# Patient Record
Sex: Female | Born: 1980 | Race: Black or African American | Hispanic: No | Smoking: Never smoker
Health system: Southern US, Community
[De-identification: ages and names within clinical notes are randomized; demographics above are authoritative.]

## PROBLEM LIST (undated history)

## (undated) DIAGNOSIS — J3501 Chronic tonsillitis: Secondary | ICD-10-CM

## (undated) HISTORY — PX: TUBAL LIGATION: SHX77

---

## 2014-11-08 ENCOUNTER — Emergency Department (HOSPITAL_COMMUNITY)
Admission: EM | Admit: 2014-11-08 | Discharge: 2014-11-08 | Disposition: A | Discharge status: Home / Self Care | Payer: Medicaid - Out of State | Attending: Emergency Medicine | Admitting: Emergency Medicine

## 2014-11-08 ENCOUNTER — Emergency Department (HOSPITAL_COMMUNITY): Payer: Medicaid - Out of State

## 2014-11-08 ENCOUNTER — Encounter (HOSPITAL_COMMUNITY): Payer: Self-pay

## 2014-11-08 DIAGNOSIS — S70352D Superficial foreign body, left thigh, subsequent encounter: Secondary | ICD-10-CM | POA: Insufficient documentation

## 2014-11-08 DIAGNOSIS — Z4800 Encounter for change or removal of nonsurgical wound dressing: Secondary | ICD-10-CM | POA: Diagnosis present

## 2014-11-08 DIAGNOSIS — W3400XD Accidental discharge from unspecified firearms or gun, subsequent encounter: Secondary | ICD-10-CM | POA: Insufficient documentation

## 2014-11-08 DIAGNOSIS — W3400XA Accidental discharge from unspecified firearms or gun, initial encounter: Secondary | ICD-10-CM

## 2014-11-08 MED ORDER — ACETAMINOPHEN 325 MG PO TABS
650.0000 mg | ORAL_TABLET | Freq: Once | ORAL | Status: AC
  Administered 2014-11-08: 650 mg via ORAL
  Filled 2014-11-08: qty 2

## 2014-11-08 NOTE — ED Notes (Signed)
Patient left ED at 1720 with pain 4/10

## 2014-11-08 NOTE — Discharge Instructions (Signed)
If you were given medicines take as directed.  If you are on coumadin or contraceptives realize their levels and effectiveness is altered by many different medicines.  If you have any reaction (rash, tongues swelling, other) to the medicines stop taking and see a physician.   Please follow up as directed and return to the ER or see a physician for new or worsening symptoms.  Thank you. Filed Vitals:   11/08/14 1431  BP: 97/60  Pulse: 63  Temp: 98.1 F (36.7 C)  TempSrc: Oral  Resp: 18  Height: 5\' 4"  (1.626 m)  Weight: 115 lb (52.164 kg)  SpO2: 100%
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
## 2014-11-08 NOTE — ED Notes (Signed)
Left leg warm and dry, pedal pulse present.  Dressing dry and intact.

## 2016-02-02 ENCOUNTER — Emergency Department
Admission: EM | Admit: 2016-02-02 | Discharge: 2016-02-02 | Discharge status: Against Medical Advice | Payer: Medicaid Other | Attending: Emergency Medicine | Admitting: Emergency Medicine

## 2016-02-02 ENCOUNTER — Encounter: Payer: Self-pay | Admitting: Medical Oncology

## 2016-02-02 DIAGNOSIS — N946 Dysmenorrhea, unspecified: Secondary | ICD-10-CM | POA: Diagnosis not present

## 2016-02-02 DIAGNOSIS — Z3202 Encounter for pregnancy test, result negative: Secondary | ICD-10-CM | POA: Insufficient documentation

## 2016-02-02 DIAGNOSIS — R102 Pelvic and perineal pain: Secondary | ICD-10-CM | POA: Diagnosis present

## 2016-02-02 LAB — POCT PREGNANCY, URINE: Preg Test, Ur: NEGATIVE

## 2016-02-02 NOTE — ED Notes (Signed)
Pt not in room or bathroom, Dr Joni Fears made aware.

## 2016-02-02 NOTE — ED Notes (Signed)
Pt reports starting her period 3 days ago and since has been having more than usual lower abd cramping.

## 2016-02-02 NOTE — ED Provider Notes (Signed)
[facility] Emergency Department Provider Note  ____________________________________________  Time seen: 11:00 AM  I have reviewed the triage vital signs and the nursing notes.   HISTORY  Chief Complaint Abdominal Cramping    HPI Levina Frutoso Chase is a 35 y.o. female who complains of pelvic cramping for the past 3 days while having her period. This is a normal time for her but normally her periods are lighter and this time around it feels heavier and more crampy with severe pain. No vaginal discharge no urinary symptoms. No chest pain shortness of breath or other concerns. No other medical history.     History reviewed. No pertinent past medical history.   There are no active problems to display for this patient.    Past Surgical History  Procedure Laterality Date  . Tubal ligation       Current Outpatient Rx  Name  Route  Sig  Dispense  Refill  . HYDROcodone-acetaminophen (NORCO/VICODIN) 5-325 MG per tablet   Oral   Take 1 tablet by mouth every 6 (six) hours as needed for moderate pain.            Allergies Percocet   No family history on file.  Social History Social History  Substance Use Topics  . Smoking status: Never Smoker   . Smokeless tobacco: None  . Alcohol Use: No    Review of Systems  Constitutional:   No fever or chills. No weight changes Eyes:   No vision changes.  ENT:   No sore throat. No rhinorrhea. Cardiovascular:   No chest pain. Respiratory:   No dyspnea or cough. Gastrointestinal:   Also to pelvic pain without vomiting or diarrhea.  No BRBPR or melena. Genitourinary:   Negative for dysuria or difficulty urinating. Musculoskeletal:   Negative for focal pain or swelling Skin:   Negative for rash. Neurological:   Negative for headaches, focal weakness or numbness.  10-point ROS otherwise negative.  ____________________________________________   PHYSICAL EXAM:  VITAL SIGNS: ED Triage Vitals  Enc  Vitals Group     BP 02/02/16 0754 109/67 mmHg     Pulse Rate 02/02/16 0754 73     Resp 02/02/16 0754 18     Temp 02/02/16 0754 98.4 F (36.9 C)     Temp Source 02/02/16 0754 Temporal     SpO2 02/02/16 0754 99 %     Weight 02/02/16 0754 115 lb (52.164 kg)     Height 02/02/16 0754 5\' 4"  (1.626 m)     Head Cir --      Peak Flow --      Pain Score 02/02/16 0754 10     Pain Loc --      Pain Edu? --      Excl. in [place]? --     Vital signs reviewed, nursing assessments reviewed.   Constitutional:   Alert and oriented. Well appearing and in no distress. Eyes:   No scleral icterus. No conjunctival pallor. PERRL. EOMI ENT   Head:   Normocephalic and atraumatic.   Nose:   No congestion/rhinnorhea. No septal hematoma   Mouth/Throat:   MMM, no pharyngeal erythema. No peritonsillar mass.    Neck:   No stridor. No SubQ emphysema. No meningismus. Hematological/Lymphatic/Immunilogical:   No cervical lymphadenopathy. Cardiovascular:   RRR. Symmetric bilateral radial and DP pulses.  No murmurs.  Respiratory:   Normal respiratory effort without tachypnea nor retractions. Breath sounds are clear and equal bilaterally. No wheezes/rales/rhonchi. Gastrointestinal:   Soft and  nontender. Non distended. There is no CVA tenderness.  No rebound, rigidity, or guarding. Genitourinary:   deferred Musculoskeletal:   Nontender with normal range of motion in all extremities. No joint effusions.  No lower extremity tenderness.  No edema. Neurologic:   Normal speech and language.  CN 2-10 normal. Motor grossly intact. No gross focal neurologic deficits are appreciated.  Skin:    Skin is warm, dry and intact. No rash noted.  No petechiae, purpura, or bullae. Psychiatric:   Mood and affect are normal. ____________________________________________    LABS (pertinent positives/negatives) (all labs ordered are listed, but only abnormal results are displayed) Labs Reviewed  POCT PREGNANCY, URINE    ____________________________________________   EKG    ____________________________________________    RADIOLOGY    ____________________________________________   PROCEDURES   ____________________________________________   INITIAL IMPRESSION / [place] / ED COURSE  Pertinent labs & imaging results that were available during my care of the patient were reviewed by me and considered in my medical decision making (see chart for details).  Patient well appearing no acute distress normal vital signs. Presents with dysmenorrhea. No signs or symptoms to suggest UTI pyelonephritis STI PID TOA. Pregnancy test negative. I discussed with the patient that we should just manage this expectantly and control her symptoms as this is most likely a usual period that is just more severe pain than normal. I offered her tramadol or Percocet for the pain. However, while I was busy with another patient experiencing emergency condition, this patient eloped from the emergency department by her to receiving discharge instructions and prescriptions. She is medically stable and suitable for outpatient follow-up. We did discuss taking NSAIDs for pain control.     ____________________________________________   FINAL CLINICAL IMPRESSION(S) / ED DIAGNOSES  Final diagnoses:  Dysmenorrhea      Carrie Mew, MD 02/02/16 1217

## 2016-02-02 NOTE — ED Notes (Signed)
Pt resting in bed.

## 2017-03-11 ENCOUNTER — Emergency Department
Admission: EM | Admit: 2017-03-11 | Discharge: 2017-03-12 | Disposition: A | Discharge status: Home / Self Care | Payer: Medicaid Other | Attending: Emergency Medicine | Admitting: Emergency Medicine

## 2017-03-11 ENCOUNTER — Encounter: Payer: Self-pay | Admitting: Emergency Medicine

## 2017-03-11 DIAGNOSIS — N946 Dysmenorrhea, unspecified: Secondary | ICD-10-CM | POA: Diagnosis not present

## 2017-03-11 DIAGNOSIS — D259 Leiomyoma of uterus, unspecified: Secondary | ICD-10-CM | POA: Diagnosis not present

## 2017-03-11 DIAGNOSIS — R109 Unspecified abdominal pain: Secondary | ICD-10-CM

## 2017-03-11 DIAGNOSIS — Z79899 Other long term (current) drug therapy: Secondary | ICD-10-CM | POA: Diagnosis not present

## 2017-03-11 LAB — URINALYSIS, COMPLETE (UACMP) WITH MICROSCOPIC
Bacteria, UA: NONE SEEN
Bilirubin Urine: NEGATIVE
Glucose, UA: NEGATIVE mg/dL
Hgb urine dipstick: NEGATIVE
Ketones, ur: NEGATIVE mg/dL
Leukocytes, UA: NEGATIVE
Nitrite: NEGATIVE
PH: 5 (ref 5.0–8.0)
Protein, ur: 30 mg/dL — AB
SPECIFIC GRAVITY, URINE: 1.034 — AB (ref 1.005–1.030)

## 2017-03-11 LAB — COMPREHENSIVE METABOLIC PANEL
ALK PHOS: 65 U/L (ref 38–126)
ALT: 10 U/L — ABNORMAL LOW (ref 14–54)
AST: 16 U/L (ref 15–41)
Albumin: 4 g/dL (ref 3.5–5.0)
Anion gap: 6 (ref 5–15)
BUN: 12 mg/dL (ref 6–20)
CO2: 23 mmol/L (ref 22–32)
CREATININE: 0.57 mg/dL (ref 0.44–1.00)
Calcium: 8.6 mg/dL — ABNORMAL LOW (ref 8.9–10.3)
Chloride: 105 mmol/L (ref 101–111)
Glucose, Bld: 102 mg/dL — ABNORMAL HIGH (ref 65–99)
Potassium: 3.8 mmol/L (ref 3.5–5.1)
Sodium: 134 mmol/L — ABNORMAL LOW (ref 135–145)
Total Bilirubin: 0.8 mg/dL (ref 0.3–1.2)
Total Protein: 7.4 g/dL (ref 6.5–8.1)

## 2017-03-11 LAB — CBC
HCT: 37.9 % (ref 35.0–47.0)
Hemoglobin: 12.8 g/dL (ref 12.0–16.0)
MCH: 30.7 pg (ref 26.0–34.0)
MCHC: 33.9 g/dL (ref 32.0–36.0)
MCV: 90.7 fL (ref 80.0–100.0)
PLATELETS: 237 10*3/uL (ref 150–440)
RBC: 4.18 MIL/uL (ref 3.80–5.20)
RDW: 12.2 % (ref 11.5–14.5)
WBC: 8.7 10*3/uL (ref 3.6–11.0)

## 2017-03-11 LAB — LIPASE, BLOOD: Lipase: 24 U/L (ref 11–51)

## 2017-03-11 LAB — POCT PREGNANCY, URINE: Preg Test, Ur: NEGATIVE

## 2017-03-11 MED ORDER — ONDANSETRON 4 MG PO TBDP
4.0000 mg | ORAL_TABLET | Freq: Once | ORAL | Status: AC | PRN
  Administered 2017-03-11: 4 mg via ORAL
  Filled 2017-03-11: qty 1

## 2017-03-11 NOTE — ED Triage Notes (Signed)
Pt ambulatory to triage in NAD, report lower abd cramping since yesterday, reports nausea w/o vomiting.  Denies urinary sx.  Pt started period yesterday.

## 2017-03-11 NOTE — ED Notes (Signed)
Pt states unable to give urine sample at this time, pt given specimen cup and asked to provide sample when able.  Pt verbalized understanding.

## 2017-03-11 NOTE — ED Notes (Addendum)
Patient reports that she has this abdominal pain every month with her period. Patient denies any hx of cyst. Patient reports no change in her periods, no excess bleeding or clots. Patient states the nausea medication did help some. Patient reports taking midol, motrin and BC powders without relief.

## 2017-03-11 NOTE — ED Notes (Signed)
ED Provider at bedside. 

## 2017-03-12 ENCOUNTER — Emergency Department: Payer: Medicaid Other

## 2017-03-12 LAB — WET PREP, GENITAL
Clue Cells Wet Prep HPF POC: NONE SEEN
Sperm: NONE SEEN
Trich, Wet Prep: NONE SEEN
Yeast Wet Prep HPF POC: NONE SEEN

## 2017-03-12 LAB — CHLAMYDIA/NGC RT PCR (ARMC ONLY)
CHLAMYDIA TR: NOT DETECTED
N GONORRHOEAE: NOT DETECTED

## 2017-03-12 MED ORDER — TRAMADOL HCL 50 MG PO TABS: 50.0000 mg | ORAL_TABLET | Freq: Four times a day (QID) | ORAL | 0 refills | Status: DC | PRN

## 2017-03-12 MED ORDER — ACETAMINOPHEN 500 MG PO TABS
1000.0000 mg | ORAL_TABLET | Freq: Once | ORAL | Status: AC
  Administered 2017-03-12: 1000 mg via ORAL
  Filled 2017-03-12: qty 2

## 2017-03-12 MED ORDER — TRAMADOL HCL 50 MG PO TABS
50.0000 mg | ORAL_TABLET | Freq: Once | ORAL | Status: AC
  Administered 2017-03-12: 50 mg via ORAL
  Filled 2017-03-12: qty 1

## 2017-03-12 MED ORDER — KETOROLAC TROMETHAMINE 60 MG/2ML IM SOLN
60.0000 mg | Freq: Once | INTRAMUSCULAR | Status: AC
  Administered 2017-03-12: 60 mg via INTRAMUSCULAR
  Filled 2017-03-12: qty 2

## 2017-03-12 NOTE — ED Provider Notes (Signed)
[facility] Emergency Department Provider Note   ____________________________________________   First MD Initiated Contact with Patient 03/11/17 2356     (approximate)  I have reviewed the triage vital signs and the nursing notes.   HISTORY  Chief Complaint Abdominal Pain    HPI Bridget Wong is a 36 y.o. female who comes into the hospital today with bad cramps. She reports it hurts so bad. She reports that none of the pills she's taken has helped. The patient reports her period started yesterday and she's had cramps since then. Typically she has cramps for about 3 days and a period last about 5 days. The patient reports that she's had painful periods since about 2011 when she tied her tubes. The patient reports that last year she was evaluated for these painful periods with x-rays and surgery but they didn't see anything wrong. The patient reports it is been hurting like this for 7 years every time she gets her period but she couldn't tolerate it tonight. The patient rates her pain a 10 out of 10 in intensity.She reports that the pain feels like contractions. The patient took [facility] powder, Tylenol, Midol, Aleve and she reports that nothing works. She is asking for something stronger for her pain.   History reviewed. No pertinent past medical history.  There are no active problems to display for this patient.   Past Surgical History:  Procedure Laterality Date  . TUBAL LIGATION      Prior to Admission medications   Medication Sig Start Date End Date Taking? Authorizing Provider  HYDROcodone-acetaminophen (NORCO/VICODIN) 5-325 MG per tablet Take 1 tablet by mouth every 6 (six) hours as needed for moderate pain.    Historical Provider, MD  traMADol (ULTRAM) 50 MG tablet Take 1 tablet (50 mg total) by mouth every 6 (six) hours as needed. 03/12/17   Loney Hering, MD    Allergies Patient has no active allergies.  History reviewed. No pertinent family  history.  Social History Social History  Substance Use Topics  . Smoking status: Never Smoker  . Smokeless tobacco: Never Used  . Alcohol use No    Review of Systems  Constitutional: No fever/chills Eyes: No visual changes. ENT: No sore throat. Cardiovascular: Denies chest pain. Respiratory: Denies shortness of breath. Gastrointestinal:  abdominal pain.  No nausea, no vomiting.  No diarrhea.  No constipation. Genitourinary: Negative for dysuria. Musculoskeletal: Negative for back pain. Skin: Negative for rash. Neurological: Negative for headaches, focal weakness or numbness.   ____________________________________________   PHYSICAL EXAM:  VITAL SIGNS: ED Triage Vitals  Enc Vitals Group     BP 03/11/17 2303 122/80     Pulse Rate 03/11/17 2303 70     Resp 03/11/17 2303 20     Temp 03/11/17 2303 98.1 F (36.7 C)     Temp Source 03/11/17 2303 Oral     SpO2 03/11/17 2303 100 %     Weight 03/11/17 2304 130 lb (59 kg)     Height 03/11/17 2304 5\' 4"  (1.626 m)     Head Circumference --      Peak Flow --      Pain Score 03/11/17 2303 10     Pain Loc --      Pain Edu? --      Excl. in [place]? --     Constitutional: Alert and oriented. Well appearing and in moderate distress. Eyes: Conjunctivae are normal. PERRL. EOMI. Head: Atraumatic. Nose: No congestion/rhinnorhea. Mouth/Throat: Mucous membranes  are moist.  Oropharynx non-erythematous. Cardiovascular: Normal rate, regular rhythm. Grossly normal heart sounds.  Good peripheral circulation. Respiratory: Normal respiratory effort.  No retractions. Lungs CTAB. Gastrointestinal: Soft and nontender. No distention. positive bowel sounds Genitourinary: Normal external genitalia with mild blood in the vaginal vault and some uterine tenderness to palpation. No cervical motion tenderness to palpation. Musculoskeletal: No lower extremity tenderness nor edema.   Neurologic:  Normal speech and language.  Skin:  Skin is warm, dry and  intact. Marland Kitchen Psychiatric: Mood and affect are normal.   ____________________________________________   LABS (all labs ordered are listed, but only abnormal results are displayed)  Labs Reviewed  WET PREP, GENITAL - Abnormal; Notable for the following:       Result Value   WBC, Wet Prep HPF POC MODERATE (*)    All other components within normal limits  COMPREHENSIVE METABOLIC PANEL - Abnormal; Notable for the following:    Sodium 134 (*)    Glucose, Bld 102 (*)    Calcium 8.6 (*)    ALT 10 (*)    All other components within normal limits  URINALYSIS, COMPLETE (UACMP) WITH MICROSCOPIC - Abnormal; Notable for the following:    Color, Urine YELLOW (*)    APPearance CLEAR (*)    Specific Gravity, Urine 1.034 (*)    Protein, ur 30 (*)    Squamous Epithelial / LPF 0-5 (*)    All other components within normal limits  CHLAMYDIA/NGC RT PCR ([facility] ONLY)  LIPASE, BLOOD  CBC  POC URINE PREG, ED  POCT PREGNANCY, URINE   ____________________________________________  EKG  none ____________________________________________  RADIOLOGY  [place] abd and pelvis ____________________________________________   PROCEDURES  Procedure(s) performed: None  Procedures  Critical Care performed: No  ____________________________________________   INITIAL IMPRESSION / ASSESSMENT AND PLAN / ED COURSE  Pertinent labs & imaging results that were available during my care of the patient were reviewed by me and considered in my medical decision making (see chart for details).  This is a 36 year old female who comes into the hospital today with some uterine tenderness to palpation cramping menstrual cycle. The patient has had this for some time but I am unsure exactly how much she is been evaluated. The patient's pelvic exam was unremarkable as was her wet prep and GC and chlamydia. At this time I am awaiting the results of the patient's ultrasound for evaluation of this pain. The patient did  receive a dose of Toradol and some tramadol for her pain.  Clinical Course as of Mar 12 352  Sat Mar 12, 2017  0349 1. 2.3 cm right posterior uterine fibroid. Trace free fluid. 2. Otherwise negative pelvic ultrasound   US Pelvis Complete [AW]    Clinical Course User Index [AW] Loney Hering, MD   The patient's ultrasound shows a fibroid. She will be discharged home to follow-up.  ____________________________________________   FINAL CLINICAL IMPRESSION(S) / ED DIAGNOSES  Final diagnoses:  Abdominal pain  Dysmenorrhea  Uterine leiomyoma, unspecified location      NEW MEDICATIONS STARTED DURING THIS VISIT:  New Prescriptions   TRAMADOL (ULTRAM) 50 MG TABLET    Take 1 tablet (50 mg total) by mouth every 6 (six) hours as needed.     Note:  This document was prepared using Dragon voice recognition software and may include unintentional dictation errors.    Loney Hering, MD 03/12/17 [phone]

## 2017-03-12 NOTE — Discharge Instructions (Signed)
Please follow up with the OB/GYN for further eval and treatment of your painful periods

## 2017-03-12 NOTE — ED Notes (Signed)
Updated patient on plan.

## 2017-03-16 ENCOUNTER — Encounter: Payer: Self-pay | Admitting: Obstetrics & Gynecology

## 2017-03-16 ENCOUNTER — Ambulatory Visit (INDEPENDENT_AMBULATORY_CARE_PROVIDER_SITE_OTHER): Payer: Medicaid Other | Admitting: Obstetrics & Gynecology

## 2017-03-16 VITALS — BP 100/60 | HR 80 | Ht 64.0 in | Wt 131.0 lb

## 2017-03-16 DIAGNOSIS — D251 Intramural leiomyoma of uterus: Secondary | ICD-10-CM | POA: Diagnosis not present

## 2017-03-16 DIAGNOSIS — N920 Excessive and frequent menstruation with regular cycle: Secondary | ICD-10-CM | POA: Diagnosis not present

## 2017-03-16 DIAGNOSIS — N946 Dysmenorrhea, unspecified: Secondary | ICD-10-CM | POA: Diagnosis not present

## 2017-03-16 NOTE — Patient Instructions (Signed)
Total Laparoscopic Hysterectomy A total laparoscopic hysterectomy is a minimally invasive surgery to remove your uterus and cervix. This surgery is performed by making several small cuts (incisions) in your abdomen. It can also be done with a thin, lighted tube (laparoscope) inserted into two small incisions in your lower abdomen. Your fallopian tubes and ovaries can be removed (bilateral salpingo-oophorectomy) during this surgery as well.Benefits of minimally invasive surgery include:  Less pain.  Less risk of blood loss.  Less risk of infection.  Quicker return to normal activities. Tell a health care provider about:  Any allergies you have.  All medicines you are taking, including vitamins, herbs, eye drops, creams, and over-the-counter medicines.  Any problems you or family members have had with anesthetic medicines.  Any blood disorders you have.  Any surgeries you have had.  Any medical conditions you have. What are the risks? Generally, this is a safe procedure. However, as with any procedure, complications can occur. Possible complications include:  Bleeding.  Blood clots in the legs or lung.  Infection.  Injury to surrounding organs.  Problems with anesthesia.  Early menopause symptoms (hot flashes, night sweats, insomnia).  Risk of conversion to an open abdominal incision. What happens before the procedure?  Ask your health care provider about changing or stopping your regular medicines.  Do not take aspirin or blood thinners (anticoagulants) for 1 week before the surgery or as told by your health care provider.  Do not eat or drink anything for 8 hours before the surgery or as told by your health care provider.  Quit smoking if you smoke.  Arrange for a ride home after surgery and for someone to help you at home during recovery. What happens during the procedure?  You will be given antibiotic medicine.  An IV tube will be placed in your arm. You will  be given medicine to make you sleep (general anesthetic).  A gas (carbon dioxide) will be used to inflate your abdomen. This will allow your surgeon to look inside your abdomen, perform your surgery, and treat any other problems found if necessary.  Three or four small incisions (often less than 1/2 inch) will be made in your abdomen. One of these incisions will be made in the area of your belly button (navel). The laparoscope will be inserted into the incision. Your surgeon will look through the laparoscope while doing your procedure.  Other surgical instruments will be inserted through the other incisions.  Your uterus may be removed through your vagina or cut into small pieces and removed through the small incisions.  Your incisions will be closed. What happens after the procedure?  The gas will be released from inside your abdomen.  You will be taken to the recovery area where a nurse will watch and check your progress. Once you are awake, stable, and taking fluids well, without other problems, you will return to your room or be allowed to go home.  There is usually minimal discomfort following the surgery because the incisions are so small.  You will be given pain medicine while you are in the hospital and for when you go home. This information is not intended to replace advice given to you by your health care provider. Make sure you discuss any questions you have with your health care provider. Document Released: 08/22/2007 Document Revised: 04/01/2016 Document Reviewed: 05/15/2013 Elsevier Interactive Patient Education  2017 [place].   Medroxyprogesterone injection [Contraceptive] What is this medicine? MEDROXYPROGESTERONE (me DROX ee proe JES  te rone) contraceptive injections prevent pregnancy. They provide effective birth control for 3 months. Depo-subQ Provera 104 is also used for treating pain related to endometriosis. This medicine may be used for other purposes; ask  your health care provider or pharmacist if you have questions. COMMON BRAND NAME(S): Depo-Provera, Depo-subQ Provera 104 What should I tell my health care provider before I take this medicine? They need to know if you have any of these conditions: -frequently drink alcohol -asthma -blood vessel disease or a history of a blood clot in the lungs or legs -bone disease such as osteoporosis -breast cancer -diabetes -eating disorder (anorexia nervosa or bulimia) -high blood pressure -HIV infection or AIDS -kidney disease -liver disease -mental depression -migraine -seizures (convulsions) -stroke -tobacco smoker -vaginal bleeding -an unusual or allergic reaction to medroxyprogesterone, other hormones, medicines, foods, dyes, or preservatives -pregnant or trying to get pregnant -breast-feeding How should I use this medicine? Depo-Provera Contraceptive injection is given into a muscle. Depo-subQ Provera 104 injection is given under the skin. These injections are given by a health care professional. You must not be pregnant before getting an injection. The injection is usually given during the first 5 days after the start of a menstrual period or 6 weeks after delivery of a baby. Talk to your pediatrician regarding the use of this medicine in children. Special care may be needed. These injections have been used in female children who have started having menstrual periods. Overdosage: If you think you have taken too much of this medicine contact a poison control center or emergency room at once. NOTE: This medicine is only for you. Do not share this medicine with others. What if I miss a dose? Try not to miss a dose. You must get an injection once every 3 months to maintain birth control. If you cannot keep an appointment, call and reschedule it. If you wait longer than 13 weeks between Depo-Provera contraceptive injections or longer than 14 weeks between Depo-subQ Provera 104 injections, you  could get pregnant. Use another method for birth control if you miss your appointment. You may also need a pregnancy test before receiving another injection. What may interact with this medicine? Do not take this medicine with any of the following medications: -bosentan This medicine may also interact with the following medications: -aminoglutethimide -antibiotics or medicines for infections, especially rifampin, rifabutin, rifapentine, and griseofulvin -aprepitant -barbiturate medicines such as phenobarbital or primidone -bexarotene -carbamazepine -medicines for seizures like ethotoin, felbamate, oxcarbazepine, phenytoin, topiramate -modafinil -St. John's wort This list may not describe all possible interactions. Give your health care provider a list of all the medicines, herbs, non-prescription drugs, or dietary supplements you use. Also tell them if you smoke, drink alcohol, or use illegal drugs. Some items may interact with your medicine. What should I watch for while using this medicine? This drug does not protect you against HIV infection (AIDS) or other sexually transmitted diseases. Use of this product may cause you to lose calcium from your bones. Loss of calcium may cause weak bones (osteoporosis). Only use this product for more than 2 years if other forms of birth control are not right for you. The longer you use this product for birth control the more likely you will be at risk for weak bones. Ask your health care professional how you can keep strong bones. You may have a change in bleeding pattern or irregular periods. Many females stop having periods while taking this drug. If you have received your injections on time,  your chance of being pregnant is very low. If you think you may be pregnant, see your health care professional as soon as possible. Tell your health care professional if you want to get pregnant within the next year. The effect of this medicine may last a long time  after you get your last injection. What side effects may I notice from receiving this medicine? Side effects that you should report to your doctor or health care professional as soon as possible: -allergic reactions like skin rash, itching or hives, swelling of the face, lips, or tongue -breast tenderness or discharge -breathing problems -changes in vision -depression -feeling faint or lightheaded, falls -fever -pain in the abdomen, chest, groin, or leg -problems with balance, talking, walking -unusually weak or tired -yellowing of the eyes or skin Side effects that usually do not require medical attention (report to your doctor or health care professional if they continue or are bothersome): -acne -fluid retention and swelling -headache -irregular periods, spotting, or absent periods -temporary pain, itching, or skin reaction at site where injected -weight gain This list may not describe all possible side effects. Call your doctor for medical advice about side effects. You may report side effects to FDA at 1-800-FDA-1088. Where should I keep my medicine? This does not apply. The injection will be given to you by a health care professional. NOTE: This sheet is a summary. It may not cover all possible information. If you have questions about this medicine, talk to your doctor, pharmacist, or health care provider.  2018 Elsevier/Gold Standard (2008-11-15 18:37:56)

## 2017-03-16 NOTE — Progress Notes (Signed)
HPI:      [name] is a 36 y.o. [phone] who LMP was Patient's last menstrual period was 03/10/2017., presents today for a problem visit.  She complains of menorrhagia that  began several months ago and its severity is described as severe.  She has regular periods every 28 days and they are associated with moderate menstrual cramping.  She has used the following for attempts at control: maxi pad, tampon and also has tried Depo (continued to bleed) in disctant past without help..  Previous evaluation: none. Prior Diagnosis: uterine fibroids and also could be DUB. Previous Treatment: none.  She is single partner, contraception - tubal ligation.  Contraception: none. Hx of STDs: none. She is premenopausal.  PMHx: She  has no past medical history on file. Also,  has a past surgical history that includes Tubal ligation., family history includes Cancer in her mother; Dysmenorrhea in her sister.,  reports that she has never smoked. She has never used smokeless tobacco. She reports that she does not drink alcohol or use drugs.  She has a current medication list which includes the following prescription(s): hydrocodone-acetaminophen and tramadol. Also, is allergic to oxycodone-acetaminophen.  Review of Systems  Constitutional: Negative for chills, fever and malaise/fatigue.  HENT: Negative for congestion, sinus pain and sore throat.   Eyes: Negative for blurred vision and pain.  Respiratory: Negative for cough and wheezing.   Cardiovascular: Negative for chest pain and leg swelling.  Gastrointestinal: Negative for abdominal pain, constipation, diarrhea, heartburn, nausea and vomiting.  Genitourinary: Negative for dysuria, frequency, hematuria and urgency.  Musculoskeletal: Negative for back pain, joint pain, myalgias and neck pain.  Skin: Negative for itching and rash.  Neurological: Negative for dizziness, tremors and weakness.  Endo/Heme/Allergies: Does not bruise/bleed easily.    Psychiatric/Behavioral: Negative for depression. The patient is not nervous/anxious and does not have insomnia.     Objective: BP 100/60   Pulse 80   Ht 5\' 4"  (1.626 m)   Wt 131 lb (59.4 kg)   LMP 03/10/2017   BMI 22.49 kg/m  Physical Exam  Constitutional: She is oriented to person, place, and time. She appears well-developed and well-nourished. No distress.  Genitourinary: Rectum normal, vagina normal and uterus normal. Pelvic exam was performed with patient supine. There is no rash or lesion on the right labia. There is no rash or lesion on the left labia. Vagina exhibits no lesion. No bleeding in the vagina. Right adnexum does not display mass and does not display tenderness. Left adnexum does not display mass and does not display tenderness. Cervix does not exhibit motion tenderness, lesion, friability or polyp.   Uterus is mobile and midaxial. Uterus is not enlarged or exhibiting a mass.  HENT:  Head: Normocephalic and atraumatic. Head is without laceration.  Right Ear: Hearing normal.  Left Ear: Hearing normal.  Nose: No epistaxis.  No foreign bodies.  Mouth/Throat: Uvula is midline, oropharynx is clear and moist and mucous membranes are normal.  Eyes: Pupils are equal, round, and reactive to light.  Neck: Normal range of motion. Neck supple. No thyromegaly present.  Cardiovascular: Normal rate and regular rhythm.  Exam reveals no gallop and no friction rub.   No murmur heard. Pulmonary/Chest: Effort normal and breath sounds normal. No respiratory distress. She has no wheezes. Right breast exhibits no mass, no skin change and no tenderness. Left breast exhibits no mass, no skin change and no tenderness.  Abdominal: Soft. Bowel sounds are normal. She exhibits no distension. There  is no tenderness. There is no rebound.  Musculoskeletal: Normal range of motion.  Neurological: She is alert and oriented to person, place, and time. No cranial nerve deficit.  Skin: Skin is warm and  dry.  Psychiatric: She has a normal mood and affect. Judgment normal.  Vitals reviewed.   ASSESSMENT/PLAN:  dysfunctional uterine bleeding, menorrhagia and uterine fibroids  Problem List Items Addressed This Visit    None    Visit Diagnoses    Dysmenorrhea    -  Primary   Intramural leiomyoma of uterus       Menorrhagia with regular cycle        Patient has abnormal uterine bleeding . She has a normal exam today, with no evidence of lesions.  Evaluation includes the following: exam, labs such as hormonal testing, and pelvic ultrasound to evaluate for any structural gynecologic abnormalities. These studies show fibroid (small) and no other anomalies.  Treatment option for menorrhagia or menometrorrhagia discussed in great detail with the patient.  Options include hormonal therapy, IUD therapy such as Mirena, D&C, Ablation, and Hysterectomy.  The pros and cons of each option discussed with patient.  She will decide and communicate back her questions and option.   Barnett Applebaum, MD, [facility], [facility] 03/16/2017  4:30 PM

## 2017-03-18 ENCOUNTER — Telehealth: Payer: Self-pay

## 2017-03-18 NOTE — Telephone Encounter (Signed)
Pt called triage line stating she was seen by [facility] earlier this week and he wanted her to call back regarding her decision for surgery. She wants to go ahead and schedule the Hysterectomy. CB# [phone]

## 2017-03-18 NOTE — Telephone Encounter (Signed)
Pt aware Bridget Wong is out of the office today and will call her with specifics when she returns to office.

## 2017-03-18 NOTE — Telephone Encounter (Signed)
Bridget Wong, pt aware you call when back in office.

## 2017-03-18 NOTE — Telephone Encounter (Signed)
Let her know Izora Gala will be calling to help arrange.  Izora Gala, Check insurance for this patient Surgery Booking Request Patient Full Name: @PATIENTNAME @  MRN: [id number]  DOB: 05/14/1981  Surgeon: Hoyt Koch, MD  Requested Surgery Date and Time: Any, maybe May 29 Primary Diagnosis and Code: Fibroid, Menorrhagia w reg cycle, severe dysmenorrhea Secondary Diagnosis and Code: no Surgical Procedure: TLH/BS L&D Notification: No Admission Status: same day surgery Length of Surgery: 1 hour (include on form to [facility]) Special Case Needs: no H&P: needs (date) Phone Interview or Office Pre-Admit: either Interpreter: no Language: e Medical Clearance: no Special Scheduling Instructions: no

## 2017-03-21 NOTE — Telephone Encounter (Signed)
Patient is aware of H&P on 04/08/17 @ 8:20am with Pre-admit Testing afterwards, and OR on 04/12/17.

## 2017-04-08 ENCOUNTER — Encounter: Payer: Self-pay | Admitting: Obstetrics & Gynecology

## 2017-04-08 ENCOUNTER — Ambulatory Visit (INDEPENDENT_AMBULATORY_CARE_PROVIDER_SITE_OTHER): Payer: Medicaid Other | Admitting: Obstetrics & Gynecology

## 2017-04-08 ENCOUNTER — Encounter
Admission: RE | Admit: 2017-04-08 | Discharge: 2017-04-08 | Disposition: A | Discharge status: Home / Self Care | Payer: Medicaid Other | Source: Ambulatory Visit | Attending: Obstetrics & Gynecology | Admitting: Obstetrics & Gynecology

## 2017-04-08 ENCOUNTER — Inpatient Hospital Stay: Admission: RE | Admit: 2017-04-08 | Payer: Medicaid - Out of State | Source: Ambulatory Visit

## 2017-04-08 VITALS — BP 100/60 | HR 64 | Ht 64.0 in | Wt 131.0 lb

## 2017-04-08 DIAGNOSIS — N920 Excessive and frequent menstruation with regular cycle: Secondary | ICD-10-CM | POA: Insufficient documentation

## 2017-04-08 DIAGNOSIS — Z809 Family history of malignant neoplasm, unspecified: Secondary | ICD-10-CM | POA: Diagnosis not present

## 2017-04-08 DIAGNOSIS — Z01812 Encounter for preprocedural laboratory examination: Secondary | ICD-10-CM | POA: Insufficient documentation

## 2017-04-08 DIAGNOSIS — N946 Dysmenorrhea, unspecified: Secondary | ICD-10-CM | POA: Insufficient documentation

## 2017-04-08 DIAGNOSIS — D251 Intramural leiomyoma of uterus: Secondary | ICD-10-CM | POA: Diagnosis not present

## 2017-04-08 DIAGNOSIS — Z9851 Tubal ligation status: Secondary | ICD-10-CM | POA: Insufficient documentation

## 2017-04-08 LAB — CBC
HEMATOCRIT: 35.4 % (ref 35.0–47.0)
Hemoglobin: 12.2 g/dL (ref 12.0–16.0)
MCH: 31.2 pg (ref 26.0–34.0)
MCHC: 34.4 g/dL (ref 32.0–36.0)
MCV: 90.8 fL (ref 80.0–100.0)
Platelets: 241 10*3/uL (ref 150–440)
RBC: 3.9 MIL/uL (ref 3.80–5.20)
RDW: 12.4 % (ref 11.5–14.5)
WBC: 3.7 10*3/uL (ref 3.6–11.0)

## 2017-04-08 LAB — TYPE AND SCREEN
ABO/RH(D): B POS
ANTIBODY SCREEN: NEGATIVE

## 2017-04-08 NOTE — Progress Notes (Addendum)
PRE-OPERATIVE HISTORY AND PHYSICAL EXAM  HPI:  Bridget Wong is a 36 y.o. [phone] Patient's last menstrual period was 03/10/2017.; she is being admitted for surgery related to abnormal uterine bleeding and fibroids.  She complains of menorrhagia that  began several months ago and its severity is described as severe.  She has regular periods every 28 days and they are associated with moderate menstrual cramping.  She has used the following for attempts at control: maxi pad, tampon and also has tried Depo (continued to bleed) in disctant past without help..  Previous evaluation: none. Prior Diagnosis: uterine fibroids (2-3 cm Right Posterior) and also could be DUB.  PMHx: No past medical history on file. Past Surgical History:  Procedure Laterality Date  . TUBAL LIGATION     Family History  Problem Relation Age of Onset  . Cancer Mother   . Dysmenorrhea Sister    Social History  Substance Use Topics  . Smoking status: Never Smoker  . Smokeless tobacco: Never Used  . Alcohol use No   Meds: Tramadol, Vicodin (PRNs) Allergies: Oxycodone-acetaminophen  Review of Systems  Constitutional: Negative for chills, fever and malaise/fatigue.  HENT: Negative for congestion, sinus pain and sore throat.   Eyes: Negative for blurred vision and pain.  Respiratory: Negative for cough and wheezing.   Cardiovascular: Negative for chest pain and leg swelling.  Gastrointestinal: Negative for abdominal pain, constipation, diarrhea, heartburn, nausea and vomiting.  Genitourinary: Negative for dysuria, frequency, hematuria and urgency.  Musculoskeletal: Negative for back pain, joint pain, myalgias and neck pain.  Skin: Negative for itching and rash.  Neurological: Negative for dizziness, tremors and weakness.  Endo/Heme/Allergies: Does not bruise/bleed easily.  Psychiatric/Behavioral: Negative for depression. The patient is not nervous/anxious and does not have insomnia.     Objective: LMP  03/10/2017   BP 100/60   Pulse 64   Ht 5\' 4"  (1.626 m)   Wt 131 lb (59.4 kg)   LMP 04/03/2017   BMI 22.49 kg/m   Physical Exam  Constitutional: She is oriented to person, place, and time. She appears well-developed and well-nourished. No distress.  Genitourinary: Rectum normal, vagina normal and uterus normal. Pelvic exam was performed with patient supine. There is no rash or lesion on the right labia. There is no rash or lesion on the left labia. Vagina exhibits no lesion. No bleeding in the vagina. Right adnexum does not display mass and does not display tenderness. Left adnexum does not display mass and does not display tenderness. Cervix does not exhibit motion tenderness, lesion, friability or polyp.   Uterus is mobile and midaxial. Uterus is not enlarged or exhibiting a mass.  HENT:  Head: Normocephalic and atraumatic. Head is without laceration.  Right Ear: Hearing normal.  Left Ear: Hearing normal.  Nose: No epistaxis.  No foreign bodies.  Mouth/Throat: Uvula is midline, oropharynx is clear and moist and mucous membranes are normal.  Eyes: Pupils are equal, round, and reactive to light.  Neck: Normal range of motion. Neck supple. No thyromegaly present.  Cardiovascular: Normal rate and regular rhythm.  Exam reveals no gallop and no friction rub.   No murmur heard. Pulmonary/Chest: Effort normal and breath sounds normal. No respiratory distress. She has no wheezes. Right breast exhibits no mass, no skin change and no tenderness. Left breast exhibits no mass, no skin change and no tenderness.  Abdominal: Soft. Bowel sounds are normal. She exhibits no distension. There is no tenderness. There is no rebound.  Musculoskeletal: Normal range of motion.  Neurological: She is alert and oriented to person, place, and time. No cranial nerve deficit.  Skin: Skin is warm and dry.  Psychiatric: She has a normal mood and affect. Judgment normal.  Vitals reviewed.   Assessment: 1.  Intramural leiomyoma of uterus   2. Menorrhagia with regular cycle   3. Dysmenorrhea   Plan TLH, BS, Cystocopy.  Alternative options have been discussed.  I have had a careful discussion with this patient about all the options available and the risk/benefits of each. I have fully informed this patient that surgery may subject her to a variety of discomforts and risks: She understands that most patients have surgery with little difficulty, but problems can happen ranging from minor to fatal. These include nausea, vomiting, pain, bleeding, infection, poor healing, hernia, or formation of adhesions. Unexpected reactions may occur from any drug or anesthetic given. Unintended injury may occur to other pelvic or abdominal structures such as Fallopian tubes, ovaries, bladder, ureter (tube from kidney to bladder), or bowel. Nerves going from the pelvis to the legs may be injured. Any such injury may require immediate or later additional surgery to correct the problem. Excessive blood loss requiring transfusion is very unlikely but possible. Dangerous blood clots may form in the legs or lungs. Physical and sexual activity will be restricted in varying degrees for an indeterminate period of time but most often 2-6 weeks.  Finally, she understands that it is impossible to list every possible undesirable effect and that the condition for which surgery is done is not always cured or significantly improved, and in rare cases may be even worse.Ample time was given to answer all questions.  Barnett Applebaum, MD, [facility], [facility] 04/08/2017  8:15 AM

## 2017-04-08 NOTE — Patient Instructions (Signed)
  Your procedure is scheduled on: April 12, 2017 [facility]) Report to Same Day Surgery 2nd floor medical mall ([place] Entrance-take elevator on left to 2nd floor.  Check in with surgery information desk.) To find out your arrival time please call [phone] between 1PM - 3PM on April 11, 2017 (MONDAY)  Remember: Instructions that are not followed completely may result in serious medical risk, up to and including death, or upon the discretion of your surgeon and anesthesiologist your surgery may need to be rescheduled.    _x___ 1. Do not eat food or drink liquids after midnight. No gum chewing or hard candies                              __x__ 2. No Alcohol for 24 hours before or after surgery.   __x__3. No Smoking for 24 prior to surgery.   ____  4. Bring all medications with you on the day of surgery if instructed.    __x__ 5. Notify your doctor if there is any change in your medical condition     (cold, fever, infections).     Do not wear jewelry, make-up, hairpins, clips or nail polish.  Do not wear lotions, powders, or perfumes.   Do not shave 48 hours prior to surgery. Men may shave face and neck.  Do not bring valuables to the hospital.    [facility] is not responsible for any belongings or valuables.               Contacts, dentures or bridgework may not be worn into surgery.  Leave your suitcase in the car. After surgery it may be brought to your room.  For patients admitted to the hospital, discharge time is determined by your treatment team                      Patients discharged the day of surgery will not be allowed to drive home.  You will need someone to drive you home and stay with you the night of your procedure.    Please read over the following fact sheets that you were given:   [facility] Preparing for Surgery and or MRSA Information   __ Take anti-hypertensive (unless it includes a diuretic), cardiac, seizure, asthma,     anti-reflux and psychiatric  medicines. These include:  1.   2.  3.  4.  5.  6.  ____Fleets enema or Magnesium Citrate as directed.   _x___ Use CHG Soap or sage wipes as directed on instruction sheet   ____ Use inhalers on the day of surgery and bring to hospital day of surgery  ____ Stop Metformin and Janumet 2 days prior to surgery.    ____ Take 1/2 of usual insulin dose the night before surgery and none on the morning surgery.      _x___ Follow recommendations from Cardiologist, Pulmonologist or PCP regarding          stopping Aspirin, Coumadin, Plavix ,Eliquis, Effient, or Pradaxa, and Pletal.  X____Stop Anti-inflammatories such as Advil, Aleve, Ibuprofen, Motrin, Naproxen, Naprosyn, Goodies powders or aspirin products. OK to take Tylenol    _x___ Stop supplements until after surgery.  But may continue Vitamin D, Vitamin B, and multivitamin        ____ Bring C-Pap to the hospital.

## 2017-04-08 NOTE — Patient Instructions (Signed)
Total Laparoscopic Hysterectomy °A total laparoscopic hysterectomy is a minimally invasive surgery to remove your uterus and cervix. This surgery is performed by making several small cuts (incisions) in your abdomen. It can also be done with a thin, lighted tube (laparoscope) inserted into two small incisions in your lower abdomen. Your fallopian tubes and ovaries can be removed (bilateral salpingo-oophorectomy) during this surgery as well. Benefits of minimally invasive surgery include: °· Less pain. °· Less risk of blood loss. °· Less risk of infection. °· Quicker return to normal activities. ° °Tell a health care provider about: °· Any allergies you have. °· All medicines you are taking, including vitamins, herbs, eye drops, creams, and over-the-counter medicines. °· Any problems you or family members have had with anesthetic medicines. °· Any blood disorders you have. °· Any surgeries you have had. °· Any medical conditions you have. °What are the risks? °Generally, this is a safe procedure. However, as with any procedure, complications can occur. Possible complications include: °· Bleeding. °· Blood clots in the legs or lung. °· Infection. °· Injury to surrounding organs. °· Problems with anesthesia. °· Early menopause symptoms (hot flashes, night sweats, insomnia). °· Risk of conversion to an open abdominal incision. ° °What happens before the procedure? °· Ask your health care provider about changing or stopping your regular medicines. °· Do not take aspirin or blood thinners (anticoagulants) for 1 week before the surgery or as told by your health care provider. °· Do not eat or drink anything for 8 hours before the surgery or as told by your health care provider. °· Quit smoking if you smoke. °· Arrange for a ride home after surgery and for someone to help you at home during recovery. °What happens during the procedure? °· You will be given antibiotic medicine. °· An IV tube will be placed in your arm. You  will be given medicine to make you sleep (general anesthetic). °· A gas (carbon dioxide) will be used to inflate your abdomen. This will allow your surgeon to look inside your abdomen, perform your surgery, and treat any other problems found if necessary. °· Three or four small incisions (often less than 1/2 inch) will be made in your abdomen. One of these incisions will be made in the area of your belly button (navel). The laparoscope will be inserted into the incision. Your surgeon will look through the laparoscope while doing your procedure. °· Other surgical instruments will be inserted through the other incisions. °· Your uterus may be removed through your vagina or cut into small pieces and removed through the small incisions. °· Your incisions will be closed. °What happens after the procedure? °· The gas will be released from inside your abdomen. °· You will be taken to the recovery area where a nurse will watch and check your progress. Once you are awake, stable, and taking fluids well, without other problems, you will return to your room or be allowed to go home. °· There is usually minimal discomfort following the surgery because the incisions are so small. °· You will be given pain medicine while you are in the hospital and for when you go home. °This information is not intended to replace advice given to you by your health care provider. Make sure you discuss any questions you have with your health care provider. °Document Released: 08/22/2007 Document Revised: 04/01/2016 Document Reviewed: 05/15/2013 °Elsevier Interactive Patient Education © 2017 Elsevier Inc. ° °

## 2017-04-12 ENCOUNTER — Ambulatory Visit: Payer: Medicaid Other | Admitting: Anesthesiology

## 2017-04-12 ENCOUNTER — Encounter: Payer: Self-pay | Admitting: *Deleted

## 2017-04-12 ENCOUNTER — Encounter
Admission: RE | Disposition: A | Discharge status: Home / Self Care | Payer: Self-pay | Source: Ambulatory Visit | Attending: Obstetrics & Gynecology

## 2017-04-12 ENCOUNTER — Observation Stay
Admission: RE | Admit: 2017-04-12 | Discharge: 2017-04-13 | Disposition: A | Discharge status: Home / Self Care | Payer: Medicaid Other | Source: Ambulatory Visit | Attending: Obstetrics & Gynecology | Admitting: Obstetrics & Gynecology

## 2017-04-12 DIAGNOSIS — N920 Excessive and frequent menstruation with regular cycle: Principal | ICD-10-CM | POA: Diagnosis present

## 2017-04-12 DIAGNOSIS — D259 Leiomyoma of uterus, unspecified: Secondary | ICD-10-CM | POA: Diagnosis not present

## 2017-04-12 DIAGNOSIS — N946 Dysmenorrhea, unspecified: Secondary | ICD-10-CM | POA: Insufficient documentation

## 2017-04-12 DIAGNOSIS — D251 Intramural leiomyoma of uterus: Secondary | ICD-10-CM | POA: Diagnosis present

## 2017-04-12 DIAGNOSIS — D219 Benign neoplasm of connective and other soft tissue, unspecified: Secondary | ICD-10-CM | POA: Diagnosis present

## 2017-04-12 HISTORY — PX: LAPAROSCOPIC HYSTERECTOMY: SHX1926

## 2017-04-12 HISTORY — PX: CYSTOSCOPY: SHX5120

## 2017-04-12 LAB — ABO/RH: ABO/RH(D): B POS

## 2017-04-12 SURGERY — HYSTERECTOMY, TOTAL, LAPAROSCOPIC
Anesthesia: General | Laterality: Bilateral

## 2017-04-12 MED ORDER — PROPOFOL 10 MG/ML IV BOLUS
INTRAVENOUS | Status: DC | PRN
  Administered 2017-04-12: 120 mg via INTRAVENOUS

## 2017-04-12 MED ORDER — HYDROCODONE-ACETAMINOPHEN 5-325 MG PO TABS
1.0000 | ORAL_TABLET | ORAL | Status: DC | PRN
  Administered 2017-04-12 – 2017-04-13 (×5): 1 via ORAL
  Filled 2017-04-12 (×5): qty 1

## 2017-04-12 MED ORDER — DEXAMETHASONE SODIUM PHOSPHATE 10 MG/ML IJ SOLN
INTRAMUSCULAR | Status: AC
  Filled 2017-04-12: qty 1

## 2017-04-12 MED ORDER — SIMETHICONE 80 MG PO CHEW: 80.0000 mg | CHEWABLE_TABLET | Freq: Four times a day (QID) | ORAL | Status: DC | PRN

## 2017-04-12 MED ORDER — LACTATED RINGERS IV SOLN
INTRAVENOUS | Status: DC
  Administered 2017-04-12: 07:00:00 via INTRAVENOUS

## 2017-04-12 MED ORDER — ONDANSETRON HCL 4 MG PO TABS: 4.0000 mg | ORAL_TABLET | Freq: Four times a day (QID) | ORAL | Status: DC | PRN

## 2017-04-12 MED ORDER — FENTANYL CITRATE (PF) 100 MCG/2ML IJ SOLN
INTRAMUSCULAR | Status: DC | PRN
  Administered 2017-04-12 (×3): 50 ug via INTRAVENOUS
  Administered 2017-04-12: 100 ug via INTRAVENOUS

## 2017-04-12 MED ORDER — MIDAZOLAM HCL 2 MG/2ML IJ SOLN
INTRAMUSCULAR | Status: DC | PRN
  Administered 2017-04-12: 2 mg via INTRAVENOUS

## 2017-04-12 MED ORDER — FENTANYL CITRATE (PF) 250 MCG/5ML IJ SOLN
INTRAMUSCULAR | Status: AC
  Filled 2017-04-12: qty 5

## 2017-04-12 MED ORDER — PHENYLEPHRINE HCL 10 MG/ML IJ SOLN
INTRAMUSCULAR | Status: AC
  Filled 2017-04-12: qty 1

## 2017-04-12 MED ORDER — KETOROLAC TROMETHAMINE 30 MG/ML IJ SOLN
INTRAMUSCULAR | Status: AC
  Filled 2017-04-12: qty 1

## 2017-04-12 MED ORDER — ONDANSETRON HCL 4 MG/2ML IJ SOLN
INTRAMUSCULAR | Status: AC
  Filled 2017-04-12: qty 2

## 2017-04-12 MED ORDER — KETOROLAC TROMETHAMINE 30 MG/ML IJ SOLN
INTRAMUSCULAR | Status: DC | PRN
  Administered 2017-04-12: 30 mg via INTRAVENOUS

## 2017-04-12 MED ORDER — DEXTROSE 5 % IV SOLN
2.0000 g | Freq: Once | INTRAVENOUS | Status: AC
  Administered 2017-04-12: 2 g via INTRAVENOUS
  Filled 2017-04-12: qty 2

## 2017-04-12 MED ORDER — BUPIVACAINE HCL (PF) 0.5 % IJ SOLN
INTRAMUSCULAR | Status: DC | PRN
  Administered 2017-04-12: 8 mL

## 2017-04-12 MED ORDER — ROCURONIUM BROMIDE 100 MG/10ML IV SOLN
INTRAVENOUS | Status: DC | PRN
  Administered 2017-04-12: 5 mg via INTRAVENOUS
  Administered 2017-04-12: 40 mg via INTRAVENOUS

## 2017-04-12 MED ORDER — ACETAMINOPHEN NICU IV SYRINGE 10 MG/ML
INTRAVENOUS | Status: AC
  Filled 2017-04-12: qty 1

## 2017-04-12 MED ORDER — FENTANYL CITRATE (PF) 100 MCG/2ML IJ SOLN: 25.0000 ug | INTRAMUSCULAR | Status: DC | PRN

## 2017-04-12 MED ORDER — ONDANSETRON HCL 4 MG/2ML IJ SOLN
INTRAMUSCULAR | Status: DC | PRN
  Administered 2017-04-12: 4 mg via INTRAVENOUS

## 2017-04-12 MED ORDER — PHENYLEPHRINE HCL 10 MG/ML IJ SOLN
INTRAMUSCULAR | Status: DC | PRN
  Administered 2017-04-12: 100 ug via INTRAVENOUS

## 2017-04-12 MED ORDER — ACETAMINOPHEN 325 MG PO TABS: 650.0000 mg | ORAL_TABLET | ORAL | Status: DC | PRN

## 2017-04-12 MED ORDER — FAMOTIDINE 20 MG PO TABS
ORAL_TABLET | ORAL | Status: AC
  Filled 2017-04-12: qty 1

## 2017-04-12 MED ORDER — LACTATED RINGERS IV SOLN
INTRAVENOUS | Status: DC
  Administered 2017-04-12: 1000 mL via INTRAVENOUS

## 2017-04-12 MED ORDER — LIDOCAINE HCL (CARDIAC) 20 MG/ML IV SOLN
INTRAVENOUS | Status: DC | PRN
  Administered 2017-04-12: 50 mg via INTRAVENOUS

## 2017-04-12 MED ORDER — CEFOXITIN SODIUM-DEXTROSE 2-2.2 GM-% IV SOLR (PREMIX)
INTRAVENOUS | Status: AC
  Filled 2017-04-12: qty 50

## 2017-04-12 MED ORDER — BUPIVACAINE HCL (PF) 0.5 % IJ SOLN
INTRAMUSCULAR | Status: AC
  Filled 2017-04-12: qty 30

## 2017-04-12 MED ORDER — PROPOFOL 10 MG/ML IV BOLUS
INTRAVENOUS | Status: AC
  Filled 2017-04-12: qty 20

## 2017-04-12 MED ORDER — ONDANSETRON HCL 4 MG/2ML IJ SOLN: 4.0000 mg | Freq: Four times a day (QID) | INTRAMUSCULAR | Status: DC | PRN

## 2017-04-12 MED ORDER — FAMOTIDINE 20 MG PO TABS
20.0000 mg | ORAL_TABLET | Freq: Once | ORAL | Status: AC
  Administered 2017-04-12: 20 mg via ORAL

## 2017-04-12 MED ORDER — MORPHINE SULFATE (PF) 2 MG/ML IV SOLN: 1.0000 mg | INTRAVENOUS | Status: DC | PRN

## 2017-04-12 MED ORDER — DEXAMETHASONE SODIUM PHOSPHATE 10 MG/ML IJ SOLN
INTRAMUSCULAR | Status: DC | PRN
  Administered 2017-04-12: 4 mg via INTRAVENOUS

## 2017-04-12 MED ORDER — SUCCINYLCHOLINE CHLORIDE 20 MG/ML IJ SOLN
INTRAMUSCULAR | Status: AC
  Filled 2017-04-12: qty 1

## 2017-04-12 MED ORDER — SUGAMMADEX SODIUM 200 MG/2ML IV SOLN
INTRAVENOUS | Status: DC | PRN
  Administered 2017-04-12: 120 mg via INTRAVENOUS

## 2017-04-12 MED ORDER — SUCCINYLCHOLINE CHLORIDE 20 MG/ML IJ SOLN
INTRAMUSCULAR | Status: DC | PRN
  Administered 2017-04-12: 70 mg via INTRAVENOUS

## 2017-04-12 MED ORDER — KETOROLAC TROMETHAMINE 30 MG/ML IJ SOLN
30.0000 mg | Freq: Four times a day (QID) | INTRAMUSCULAR | Status: DC
  Administered 2017-04-12 (×2): 30 mg via INTRAVENOUS
  Filled 2017-04-12 (×2): qty 1

## 2017-04-12 MED ORDER — MIDAZOLAM HCL 2 MG/2ML IJ SOLN
INTRAMUSCULAR | Status: AC
  Filled 2017-04-12: qty 2

## 2017-04-12 MED ORDER — ROCURONIUM BROMIDE 50 MG/5ML IV SOLN
INTRAVENOUS | Status: AC
  Filled 2017-04-12: qty 1

## 2017-04-12 SURGICAL SUPPLY — 52 items
BAG URO DRAIN 2000ML W/SPOUT (MISCELLANEOUS) ×4 IMPLANT
BLADE SURG SZ11 CARB STEEL (BLADE) ×4 IMPLANT
CANISTER SUCT 1200ML W/VALVE (MISCELLANEOUS) ×4 IMPLANT
CATH FOLEY 2WAY  5CC 16FR (CATHETERS) ×2
CATH URTH 16FR FL 2W BLN LF (CATHETERS) ×2 IMPLANT
CHLORAPREP W/TINT 26ML (MISCELLANEOUS) ×4 IMPLANT
DEFOGGER SCOPE WARMER CLEARIFY (MISCELLANEOUS) ×4 IMPLANT
DERMABOND ADVANCED (GAUZE/BANDAGES/DRESSINGS) ×2
DERMABOND ADVANCED .7 DNX12 (GAUZE/BANDAGES/DRESSINGS) ×2 IMPLANT
DEVICE SUTURE ENDOST 10MM (ENDOMECHANICALS) ×4 IMPLANT
DRAPE CAMERA CLOSED 9X96 (DRAPES) IMPLANT
DRSG TEGADERM 2-3/8X2-3/4 SM (GAUZE/BANDAGES/DRESSINGS) IMPLANT
ENDOSTITCH 0 SINGLE 48 (SUTURE) ×32 IMPLANT
GAUZE SPONGE NON-WVN 2X2 STRL (MISCELLANEOUS) IMPLANT
GLOVE BIO SURGEON STRL SZ8 (GLOVE) ×20 IMPLANT
GLOVE INDICATOR 8.0 STRL GRN (GLOVE) ×20 IMPLANT
GOWN STRL REUS W/ TWL LRG LVL3 (GOWN DISPOSABLE) ×2 IMPLANT
GOWN STRL REUS W/ TWL XL LVL3 (GOWN DISPOSABLE) ×4 IMPLANT
GOWN STRL REUS W/TWL LRG LVL3 (GOWN DISPOSABLE) ×2
GOWN STRL REUS W/TWL XL LVL3 (GOWN DISPOSABLE) ×4
GRASPER SUT TROCAR 14GX15 (MISCELLANEOUS) ×4 IMPLANT
IRRIGATION STRYKERFLOW (MISCELLANEOUS) ×2 IMPLANT
IRRIGATOR STRYKERFLOW (MISCELLANEOUS) ×4
IV LACTATED RINGERS 1000ML (IV SOLUTION) ×4 IMPLANT
KIT PINK PAD W/HEAD ARE REST (MISCELLANEOUS) ×4
KIT PINK PAD W/HEAD ARM REST (MISCELLANEOUS) ×2 IMPLANT
KIT RM TURNOVER CYSTO AR (KITS) ×4 IMPLANT
LABEL OR SOLS (LABEL) ×4 IMPLANT
MANIPULATOR VCARE LG CRV RETR (MISCELLANEOUS) IMPLANT
MANIPULATOR VCARE SML CRV RETR (MISCELLANEOUS) IMPLANT
MANIPULATOR VCARE STD CRV RETR (MISCELLANEOUS) ×4 IMPLANT
NEEDLE VERESS 14GA 120MM (NEEDLE) ×4 IMPLANT
NS IRRIG 500ML POUR BTL (IV SOLUTION) ×4 IMPLANT
OCCLUDER COLPOPNEUMO (BALLOONS) ×4 IMPLANT
PACK GYN LAPAROSCOPIC (MISCELLANEOUS) ×4 IMPLANT
PAD OB MATERNITY 4.3X12.25 (PERSONAL CARE ITEMS) ×4 IMPLANT
PAD PREP 24X41 OB/GYN DISP (PERSONAL CARE ITEMS) ×4 IMPLANT
SCISSORS METZENBAUM CVD 33 (INSTRUMENTS) ×4 IMPLANT
SET CYSTO W/LG BORE CLAMP LF (SET/KITS/TRAYS/PACK) ×4 IMPLANT
SHEARS HARMONIC ACE PLUS 36CM (ENDOMECHANICALS) ×4 IMPLANT
SLEEVE ENDOPATH XCEL 5M (ENDOMECHANICALS) ×4 IMPLANT
SPONGE LAP 18X18 5 PK (GAUZE/BANDAGES/DRESSINGS) IMPLANT
SPONGE VERSALON 2X2 STRL (MISCELLANEOUS)
SUT VIC AB 0 CT1 36 (SUTURE) ×4 IMPLANT
SUT VIC AB 2-0 CT1 27 (SUTURE) ×2
SUT VIC AB 2-0 CT1 36 (SUTURE) ×4 IMPLANT
SUT VIC AB 2-0 CT1 TAPERPNT 27 (SUTURE) ×2 IMPLANT
SYR 50ML LL SCALE MARK (SYRINGE) ×4 IMPLANT
SYRINGE 10CC LL (SYRINGE) ×4 IMPLANT
TROCAR ENDO BLADELESS 11MM (ENDOMECHANICALS) ×4 IMPLANT
TROCAR XCEL NON-BLD 5MMX100MML (ENDOMECHANICALS) ×4 IMPLANT
TUBING INSUF HEATED (TUBING) ×4 IMPLANT

## 2017-04-12 NOTE — Transfer of Care (Signed)
Immediate Anesthesia Transfer of Care Note  Patient: Bridget Wong  Procedure(s) Performed: Procedure(s): HYSTERECTOMY TOTAL LAPAROSCOPIC WITH BILATERAL SALPINGECTOMY (Bilateral) CYSTOSCOPY  Patient Location: PACU  Anesthesia Type:General  Level of Consciousness: sedated  Airway & Oxygen Therapy: Patient Spontanous Breathing and Patient connected to face mask oxygen  Post-op Assessment: Report given to RN and Post -op Vital signs reviewed and stable  Post vital signs: Reviewed and stable  Last Vitals:  Vitals:   04/12/17 0623 04/12/17 1022  BP: 109/68 101/65  Pulse: 65 76  Resp: 16 (!) 9  Temp: 36.9 C     Last Pain:  Vitals:   04/12/17 0623  TempSrc: Oral         Complications: No apparent anesthesia complications

## 2017-04-12 NOTE — Anesthesia Preprocedure Evaluation (Signed)
Anesthesia Evaluation  Patient identified by MRN, date of birth, ID band Patient awake    Reviewed: Allergy & Precautions, H&P , NPO status , Patient's Chart, lab work & pertinent test results  History of Anesthesia Complications Negative for: history of anesthetic complications  Airway Mallampati: III  TM Distance: >3 FB Neck ROM: full    Dental  (+) Poor Dentition, Chipped   Pulmonary neg pulmonary ROS, neg shortness of breath,    Pulmonary exam normal breath sounds clear to auscultation       Cardiovascular Exercise Tolerance: Good (-) angina(-) Past MI and (-) DOE negative cardio ROS Normal cardiovascular exam Rhythm:regular Rate:Normal     Neuro/Psych negative neurological ROS  negative psych ROS   GI/Hepatic negative GI ROS, Neg liver ROS, neg GERD  ,  Endo/Other  negative endocrine ROS  Renal/GU      Musculoskeletal   Abdominal   Peds  Hematology negative hematology ROS (+)   Anesthesia Other Findings History reviewed. No pertinent past medical history.  Past Surgical History: No date: TUBAL LIGATION  BMI    Body Mass Index:  22.31 kg/m      Reproductive/Obstetrics negative OB ROS                             Anesthesia Physical Anesthesia Plan  ASA: I  Anesthesia Plan: General ETT   Post-op Pain Management:    Induction: Intravenous  PONV Risk Score and Plan: 4 or greater and Ondansetron, Dexamethasone, Propofol, Midazolam and Treatment may vary due to age  Airway Management Planned: Oral ETT  Additional Equipment:   Intra-op Plan:   Post-operative Plan: Extubation in OR  Informed Consent: I have reviewed the patients History and Physical, chart, labs and discussed the procedure including the risks, benefits and alternatives for the proposed anesthesia with the patient or authorized representative who has indicated his/her understanding and acceptance.    Dental Advisory Given  Plan Discussed with: Anesthesiologist, CRNA and Surgeon  Anesthesia Plan Comments: (Patient consented for risks of anesthesia including but not limited to:  - adverse reactions to medications - damage to teeth, lips or other oral mucosa - sore throat or hoarseness - Damage to heart, brain, lungs or loss of life  Patient voiced understanding.)        Anesthesia Quick Evaluation

## 2017-04-12 NOTE — Anesthesia Procedure Notes (Signed)

## 2017-04-12 NOTE — Anesthesia Postprocedure Evaluation (Signed)
Anesthesia Post Note  Patient: Bridget Wong  Procedure(s) Performed: Procedure(s) (LRB): HYSTERECTOMY TOTAL LAPAROSCOPIC WITH BILATERAL SALPINGECTOMY (Bilateral) CYSTOSCOPY  Patient location during evaluation: PACU Anesthesia Type: General Level of consciousness: awake and alert Pain management: pain level controlled Vital Signs Assessment: post-procedure vital signs reviewed and stable Respiratory status: spontaneous breathing, nonlabored ventilation, respiratory function stable and patient connected to nasal cannula oxygen Cardiovascular status: blood pressure returned to baseline and stable Postop Assessment: no signs of nausea or vomiting Anesthetic complications: no     Last Vitals:  Vitals:   04/12/17 1052 04/12/17 1115  BP: (!) 102/56 (!) 106/59  Pulse: 70 (!) 59  Resp: 14 18  Temp: 36.5 C 36.3 C    Last Pain:  Vitals:   04/12/17 1119  TempSrc:   PainSc: 3                  Precious Haws Keimya Briddell

## 2017-04-12 NOTE — Discharge Instructions (Signed)
Total Laparoscopic Hysterectomy, Care After Refer to this sheet in the next few weeks. These instructions provide you with information on caring for yourself after your procedure. Your health care provider may also give you more specific instructions. Your treatment has been planned according to current medical practices, but problems sometimes occur. Call your health care provider if you have any problems or questions after your procedure. What can I expect after the procedure?  Pain and bruising at the incision sites. You will be given pain medicine to control it.  Menopausal symptoms such as hot flashes, night sweats, and insomnia if your ovaries were removed.  Sore throat from the breathing tube that was inserted during surgery. Follow these instructions at home:  Only take over-the-counter or prescription medicines for pain, discomfort, or fever as directed by your health care provider.  Do not take aspirin. It can cause bleeding.  Do not drive when taking pain medicine.  Follow your health care provider's advice regarding diet, exercise, lifting, driving, and general activities.  Resume your usual diet as directed and allowed.  Get plenty of rest and sleep.  Do not douche, use tampons, or have sexual intercourse for at least 6 weeks, or until your health care provider gives you permission.  REMOVE your bandages (dressings) Wednesday or THURSDAY.  Monitor your temperature and notify your health care provider of a fever.  Take showers instead of baths for 2-3 weeks.  Do not drink alcohol until your health care provider gives you permission.  If you develop constipation, you may take a mild laxative with your health care provider's permission. Bran foods may help with constipation problems. Drinking enough fluids to keep your urine clear or pale yellow may help as well.  Try to have someone home with you for 1-2 weeks to help around the house.  Keep all of your follow-up  appointments as directed by your health care provider. Contact a health care provider if:  You have swelling, redness, or increasing pain around your incision sites.  You have pus coming from your incision.  You notice a bad smell coming from your incision.  Your incision breaks open.  You feel dizzy or lightheaded.  You have pain or bleeding when you urinate.  You have persistent diarrhea.  You have persistent nausea and vomiting.  You have abnormal vaginal discharge.  You have a rash.  You have any type of abnormal reaction or develop an allergy to your medicine.  You have poor pain control with your prescribed medicine. Get help right away if:  You have chest pain or shortness of breath.  You have severe abdominal pain that is not relieved with pain medicine.  You have pain or swelling in your legs. This information is not intended to replace advice given to you by your health care provider. Make sure you discuss any questions you have with your health care provider. Document Released: 08/15/2013 Document Revised: 04/01/2016 Document Reviewed: 05/15/2013 Elsevier Interactive Patient Education  2017 [employer].

## 2017-04-12 NOTE — Anesthesia Post-op Follow-up Note (Cosign Needed)
Anesthesia QCDR form completed.        

## 2017-04-12 NOTE — H&P (Signed)
History and Physical Interval Note:  04/12/2017 7:16 AM  Bridget Wong  has presented today for surgery, with the diagnosis of menorrhagia with regular cycle,severe dysmenorrhea, and fibroid uterus.  The various methods of treatment have been discussed with the patient and family. After consideration of risks, benefits and other options for treatment, the patient has consented to  Procedure(s): HYSTERECTOMY TOTAL LAPAROSCOPIC WITH BILATERAL SALPINGECTOMY, Cystoscopy as a surgical intervention .  The patient's history has been reviewed, patient examined, no change in status, stable for surgery.  Pt has the following beta blocker history-  Not taking Beta Blocker.  I have reviewed the patient's chart and labs.  Questions were answered to the patient's satisfaction.    Hoyt Koch

## 2017-04-12 NOTE — Op Note (Signed)
Operative Report:  PRE-OP DIAGNOSIS: menorrhagia with regular cycle,severe dysmenorrhea, fibroids  POST-OP DIAGNOSIS: menorrhagia with regular cycle,severe dysmenorrhea, fibroids, possible adenomyosis  PROCEDURE: Procedure(s): HYSTERECTOMY TOTAL LAPAROSCOPIC WITH BILATERAL SALPINGECTOMYs CYSTOSCOPY  SURGEON: Barnett Applebaum, MD, FACOG  ASSISTANT: Dr Georgianne Fick  ANESTHESIA: General endotracheal anesthesia  ESTIMATED BLOOD LOSS: less than 50   SPECIMENS: Uterus, Tubes.  COMPLICATIONS: None  DISPOSITION: stable to PACU  FINDINGS: Intraabdominal adhesions were noted. Colon to right ant abd wall.  Uterus enlarged and with fibroid, possible adenomyosis.  Ovaries normal.  PROCEDURE:  The patient was taken to the OR where anesthesia was administed. She was prepped and draped in the normal sterile fashion in the dorsal lithotomy position in the [place] stirrups. A time out was performed. A Graves speculum was inserted, the cervix was grasped with a single tooth tenaculum and the endometrial cavity was sounded. The cervix was progressively dilated to a size 18 [place] with [employer] dilators. A V-Care uterine manipulator was inserted in the usual fashion without incident. Gloves were changed and attention was turned to the abdomen. Foley placed.  An infraumbilical transverse 17mm skin incision was made with the scalpel after local anesthesia applied to the skin. A Veress-step needle was inserted in the usual fashion and confirmed using the hanging drop technique. A pneumoperitoneum was obtained by insufflation of CO2 (opening pressure of 75mmHg) to 55mmHg. A diagnostic laparoscopy was performed yielding the previously described findings. Attention was turned to the left lower quadrant where after visualization of the inferior epigastric vessels a 83mm skin incision was made with the scalpel. A 5 mm laparoscopic port was inserted. The same procedure was repeated in the right lower quadrant with a 59mm trocar.  Attention was turned to the left aspect of the uterus, where after visualization of the ureter, the round ligament was coagulated and transected using the 48mm Harmonic Scapel. The anterior and posterior leafs of the broad ligament were dissected off as the anterior one was coagulated and transected in a caudal direction towards the cuff of the uterine manipulator.  Attention was then turned to the left fallopian tube which was recognized by visualization of the fimbria. The tube is excised to its attachment to the uterus. The uterine-ovarian ligament and its blood vessels were carefully coagulated and transected using the Harmonic scapel.  Attention was turned to the right aspect of the uterus where the same procedure was performed.  The vesicouterine reflection of the peritoneum was dissected with the harmonic scapel and the bladder flap was created bluntly.  The uterine vessels were coagulated and transected bilaterally using the harmonic scapel. A 360 degree, circumferential colpotomy was done to completely amputate the uterus with cervix and tubes. Once the specimen was amputated it was delivered through the vagina.   The colpotomy was repaired in a simple interrupted fashion using a 0-Polysorb suture with an endo-stitch device.  Vaginal exam confirms complete closure. Additional 2 sutures placed from vagina to complete closure.  The cavity was copiously irrigated. A survey of the pelvic cavity revealed adequate hemostasis and no injury to bowel, bladder, or ureter.   A diagnostic cystoscopy was performed using saline distension of bladder with no lesions or injuries noted.  Bilateral urine flow from each ureteral orifice is visualized.  At this point the procedure was finalized. Right incision closed with 2-0 vicryl suture using the fascia closure device.  All the instruments were removed from the patient's body. Gas was expelled and patient is leveled.  Incisions are closed with skin adhesive.  Patient goes to recovery room in stable condition.  All sponge, instrument, and needle counts are correct x2.     Barnett Applebaum, MD, [facility], [facility] 04/12/2017  10:14 AM

## 2017-04-12 NOTE — Progress Notes (Signed)
Day of Surgery Procedure(s) (LRB): HYSTERECTOMY TOTAL LAPAROSCOPIC WITH BILATERAL SALPINGECTOMY (Bilateral) CYSTOSCOPY  Subjective: Patient reports no pain or bleeding.    Objective: I have reviewed patient's vital signs, intake and output and medications.  Abd: Min T, ND Incision: clean, dry and intact Extr: no calf T, no edema  Assessment: s/p Procedure(s): HYSTERECTOMY TOTAL LAPAROSCOPIC WITH BILATERAL SALPINGECTOMY (Bilateral) CYSTOSCOPY: stable  Plan: Advance diet Encourage ambulation Advance to PO medication  LOS: 0 days    Bridget Wong 04/12/2017, 3:02 PM

## 2017-04-13 DIAGNOSIS — N920 Excessive and frequent menstruation with regular cycle: Secondary | ICD-10-CM | POA: Diagnosis not present

## 2017-04-13 LAB — SURGICAL PATHOLOGY

## 2017-04-13 LAB — POCT PREGNANCY, URINE: PREG TEST UR: NEGATIVE

## 2017-04-13 LAB — HEMOGLOBIN: HEMOGLOBIN: 11.3 g/dL — AB (ref 12.0–16.0)

## 2017-04-13 MED ORDER — HYDROCODONE-ACETAMINOPHEN 5-325 MG PO TABS: 1.0000 | ORAL_TABLET | ORAL | 0 refills | Status: DC | PRN

## 2017-04-13 MED ORDER — IBUPROFEN 600 MG PO TABS: 600.0000 mg | ORAL_TABLET | Freq: Four times a day (QID) | ORAL | Status: DC

## 2017-04-13 NOTE — Discharge Summary (Signed)
Gynecology Physician Postoperative Discharge Summary  Patient ID: Bridget Wong MRN: [id number] DOB/AGE: 12-12-1980 36 y.o.  Admit Date: 04/12/2017 Discharge Date: 04/13/2017  Preoperative Diagnoses: Menorrhagia, Fibroid, Pain  Procedures: Procedure(s) (LRB): HYSTERECTOMY TOTAL LAPAROSCOPIC WITH BILATERAL SALPINGECTOMY (Bilateral) CYSTOSCOPY  Significant Labs: CBC Latest Ref Rng & Units 04/13/2017 04/08/2017 03/11/2017  WBC 3.6 - 11.0 K/uL - 3.7 8.7  Hemoglobin 12.0 - 16.0 g/dL 11.3(L) 12.2 12.8  Hematocrit 35.0 - 47.0 % - 35.4 37.9  Platelets 150 - 440 K/uL - 241 237    Hospital Course:  Bridget Wong is a 36 y.o. [id number]  admitted for scheduled surgery.  She underwent the procedures as mentioned above, her operation was uncomplicated. For further details about surgery, please refer to the operative report. Patient had an uncomplicated postoperative course. By time of discharge on POD#1, her pain was controlled on oral pain medications; she was ambulating, voiding without difficulty, tolerating regular diet and passing flatus. She was deemed stable for discharge to home.   Discharge Exam: Blood pressure (!) 111/59, pulse 70, temperature 98.7 F (37.1 C), temperature source Oral, resp. rate 18, height 5\' 4"  (1.626 m), weight 130 lb (59 kg), last menstrual period 04/03/2017, SpO2 99 %. General appearance: alert and no distress  Resp: clear to auscultation bilaterally  Cardio: regular rate and rhythm  GI: soft, non-tender; bowel sounds normal; no masses, no organomegaly.  Incision: C/D/I, no erythema, no drainage noted Pelvic: scant blood on pad  Extremities: extremities normal, atraumatic, no cyanosis or edema and Homans sign is negative, no sign of DVT  Discharged Condition: Stable  Disposition: 01-Home or Self Care  Discharge Instructions    Call MD for:  persistant nausea and vomiting    Complete by:  As directed    Call MD for:  redness, tenderness, or signs of infection  (pain, swelling, redness, odor or green/yellow discharge around incision site)    Complete by:  As directed    Call MD for:  severe uncontrolled pain    Complete by:  As directed    Call MD for:  temperature >100.4    Complete by:  As directed    Change dressing (specify)    Complete by:  As directed    Dressing change: remove any dressings tomorrow   Diet general    Complete by:  As directed    Discharge instructions    Complete by:  As directed    Resume activities according to discharge instruction sheets   Increase activity slowly    Complete by:  As directed      Allergies as of 04/13/2017      Reactions   Oxycodone-acetaminophen Swelling   Facial swelling      Medication List    STOP taking these medications   traMADol 50 MG tablet Commonly known as:  ULTRAM     TAKE these medications   acetaminophen 500 MG tablet Commonly known as:  TYLENOL Take 1,000 mg by mouth every 3 (three) hours as needed (for menstration cramping/pain).   BC FAST PAIN RELIEF 650-195-33.3 MG Pack Generic drug:  Aspirin-Salicylamide-Caffeine Take 1 packet by mouth 3 (three) times daily as needed (for menstration cramping/pain).   HYDROcodone-acetaminophen 5-325 MG tablet Commonly known as:  NORCO/VICODIN Take 1 tablet by mouth every 4 (four) hours as needed for moderate pain.   ibuprofen 200 MG tablet Commonly known as:  ADVIL,MOTRIN Take 400 mg by mouth every 3 (three) hours as needed (for menstration cramping/pain).   MIDOL  COMPLETE 500-60-15 MG Tabs Generic drug:  Acetaminophen-Caff-Pyrilamine Take 2 tablets by mouth every 3 (three) hours as needed (for menstration cramping/pain).   naproxen sodium 220 MG tablet Commonly known as:  ANAPROX Take 440 mg by mouth every 3 (three) hours as needed (for menstration cramping/pain).      Follow-up Information    Gae Dry, MD. Go in 2 week(s).   Specialty:  Obstetrics and Gynecology Why:  as scheduled Contact information: [address] [place] [address] [phone]           Barnett Applebaum, MD 04/13/2017 7:16 AM

## 2017-04-13 NOTE — Progress Notes (Signed)
Patient understands all discharge instructions and the need to make follow up appointments. Patient discharge via wheelchair with auxillary. 

## 2017-04-15 ENCOUNTER — Telehealth: Payer: Self-pay

## 2017-04-15 NOTE — Telephone Encounter (Signed)
Pt called triage line stating she had a hysterectomy by [facility] and was wondering if she could get a Rx for a stool softner. Pt advised she could get Colace/Senokot over the counter to use everyday until normal BM then every other day if needed. She is aware the more she gets up and walks/increases water intake and stops pain medication constipation should subside.

## 2017-04-18 ENCOUNTER — Telehealth: Payer: Self-pay

## 2017-04-18 MED ORDER — HYDROCODONE-ACETAMINOPHEN 5-325 MG PO TABS: 1.0000 | ORAL_TABLET | ORAL | 0 refills | Status: DC | PRN

## 2017-04-18 NOTE — Telephone Encounter (Signed)
Pt aware Rx is ready for pick up. She states she will be by here sometime today. KJ CMA

## 2017-04-18 NOTE — Telephone Encounter (Signed)
I will print Rx for her/you to help until Friday appt.

## 2017-04-18 NOTE — Telephone Encounter (Signed)
[place], [place] appointment with you is 6/15. Please advise

## 2017-04-18 NOTE — Telephone Encounter (Signed)
Pt has three pain pills left.  Can she have a refill?  Please call [phone].

## 2017-04-22 ENCOUNTER — Encounter: Payer: Self-pay | Admitting: Obstetrics & Gynecology

## 2017-04-22 ENCOUNTER — Ambulatory Visit (INDEPENDENT_AMBULATORY_CARE_PROVIDER_SITE_OTHER): Payer: Medicaid Other | Admitting: Obstetrics & Gynecology

## 2017-04-22 VITALS — BP 90/60 | HR 80 | Ht 64.0 in | Wt 124.0 lb

## 2017-04-22 DIAGNOSIS — D251 Intramural leiomyoma of uterus: Secondary | ICD-10-CM

## 2017-04-22 DIAGNOSIS — N920 Excessive and frequent menstruation with regular cycle: Secondary | ICD-10-CM

## 2017-04-22 NOTE — Progress Notes (Signed)
  Postoperative Follow-up Patient presents post op from laparoscopic assisted vaginal hysterectomy for abnormal uterine bleeding, 1 week ago.  Subjective: Patient reports some improvement in her preop symptoms. Eating a regular diet without difficulty. The patient is not having any pain.  Activity: normal activities of daily living. Patient reports vaginal sx's of Irregular bleeding. Constipation a problem, has taken ex lax and colace.  Objective: BP 90/60   Pulse 80   Ht 5\' 4"  (1.626 m)   Wt 124 lb (56.2 kg)   LMP 04/03/2017   BMI 21.28 kg/m  Physical Exam  Constitutional: She is oriented to person, place, and time. She appears well-developed and well-nourished. No distress.  Cardiovascular: Normal rate.   Pulmonary/Chest: Effort normal.  Abdominal: Soft. She exhibits no distension. There is no tenderness.  Incision Healing Well   Musculoskeletal: Normal range of motion.  Neurological: She is alert and oriented to person, place, and time. No cranial nerve deficit.  Skin: Skin is warm and dry.  Psychiatric: She has a normal mood and affect.   Assessment: s/p :  total laparoscopic hysterectomy with bilateral salpingectomy stable  Plan: Patient has done well after surgery with no apparent complications.  I have discussed the post-operative course to date, and the expected progress moving forward.  The patient understands what complications to be concerned about.  I will see the patient in routine follow up, or sooner if needed.    Activity plan: No heavy lifting.Pelvic rest emphasized.  Hoyt Koch 04/22/2017, 11:49 AM

## 2017-04-25 ENCOUNTER — Telehealth: Payer: Self-pay

## 2017-04-25 MED ORDER — FLUCONAZOLE 150 MG PO TABS: 150.0000 mg | ORAL_TABLET | Freq: Once | ORAL | 1 refills | Status: AC

## 2017-04-25 NOTE — Telephone Encounter (Signed)
Pt aware.

## 2017-04-25 NOTE — Telephone Encounter (Signed)
Rx Diflucan OK

## 2017-04-25 NOTE — Telephone Encounter (Signed)
Pt had hyst by [place] and was told not to put anything in her vagina until healed.  Can PH rx something in pill form for her for a yeast inf.?  She is very irritated down there.  [phone]

## 2017-04-25 NOTE — Telephone Encounter (Signed)
Do you need pt to be seen ?

## 2017-04-26 ENCOUNTER — Other Ambulatory Visit: Payer: Self-pay | Admitting: Obstetrics and Gynecology

## 2017-04-26 MED ORDER — FLUCONAZOLE 150 MG PO TABS: 150.0000 mg | ORAL_TABLET | Freq: Once | ORAL | 0 refills | Status: AC

## 2017-04-26 NOTE — Telephone Encounter (Signed)
Please advise. Pt is calling to find out if she needs to pick up her prescription or if it is being sent to the Pharmacy.

## 2017-05-08 ENCOUNTER — Emergency Department
Admission: EM | Admit: 2017-05-08 | Discharge: 2017-05-08 | Disposition: A | Discharge status: Home / Self Care | Payer: Medicaid Other | Attending: Emergency Medicine | Admitting: Emergency Medicine

## 2017-05-08 ENCOUNTER — Encounter: Payer: Self-pay | Admitting: Emergency Medicine

## 2017-05-08 DIAGNOSIS — J020 Streptococcal pharyngitis: Secondary | ICD-10-CM | POA: Insufficient documentation

## 2017-05-08 DIAGNOSIS — R509 Fever, unspecified: Secondary | ICD-10-CM | POA: Insufficient documentation

## 2017-05-08 DIAGNOSIS — J029 Acute pharyngitis, unspecified: Secondary | ICD-10-CM | POA: Diagnosis present

## 2017-05-08 DIAGNOSIS — R51 Headache: Secondary | ICD-10-CM | POA: Insufficient documentation

## 2017-05-08 DIAGNOSIS — Z885 Allergy status to narcotic agent status: Secondary | ICD-10-CM | POA: Diagnosis not present

## 2017-05-08 LAB — POCT RAPID STREP A: Streptococcus, Group A Screen (Direct): POSITIVE — AB

## 2017-05-08 MED ORDER — ACETAMINOPHEN 500 MG PO TABS
ORAL_TABLET | ORAL | Status: AC
  Filled 2017-05-08: qty 2

## 2017-05-08 MED ORDER — AMOXICILLIN 500 MG PO TABS: 500.0000 mg | ORAL_TABLET | Freq: Two times a day (BID) | ORAL | 0 refills | Status: AC

## 2017-05-08 MED ORDER — ACETAMINOPHEN 500 MG PO TABS
1000.0000 mg | ORAL_TABLET | Freq: Once | ORAL | Status: AC
  Administered 2017-05-08: 1000 mg via ORAL

## 2017-05-08 NOTE — ED Provider Notes (Signed)
[facility] Emergency Department Provider Note   ____________________________________________   I have reviewed the triage vital signs and the nursing notes.   HISTORY  Chief Complaint Sore Throat    HPI Bridget Wong is a 35 y.o. female presents with a sore throat, headache, fever and general malaise for several days. Patient denies any sick contacts and no recent upper respiratory illnesses. Patient reports she was recently told that her "tonsils are frequently infected and they need to come out". Patient denies fever, chills, headache, vision changes, chest pain, chest tightness, shortness of breath, abdominal pain, nausea and vomiting.  History reviewed. No pertinent past medical history.  Patient Active Problem List   Diagnosis Date Noted  . Fibroids 04/12/2017  . Intramural leiomyoma of uterus 04/08/2017  . Menorrhagia with regular cycle 04/08/2017    Past Surgical History:  Procedure Laterality Date  . CYSTOSCOPY  04/12/2017   Procedure: CYSTOSCOPY;  Surgeon: Gae Dry, MD;  Location: [facility] ORS;  Service: Gynecology;;  . LAPAROSCOPIC HYSTERECTOMY Bilateral 04/12/2017   Procedure: HYSTERECTOMY TOTAL LAPAROSCOPIC WITH BILATERAL SALPINGECTOMY;  Surgeon: Gae Dry, MD;  Location: [facility] ORS;  Service: Gynecology;  Laterality: Bilateral;  . TUBAL LIGATION      Prior to Admission medications   Medication Sig Start Date End Date Taking? Authorizing Provider  acetaminophen (TYLENOL) 500 MG tablet Take 1,000 mg by mouth every 3 (three) hours as needed (for menstration cramping/pain).    [provider]  Acetaminophen-Caff-Pyrilamine (MIDOL COMPLETE) 500-60-15 MG TABS Take 2 tablets by mouth every 3 (three) hours as needed (for menstration cramping/pain).    [provider]  amoxicillin (AMOXIL) 500 MG tablet Take 1 tablet (500 mg total) by mouth 2 (two) times daily. 05/08/17 05/18/17  Verlean Allport M, PA-C    Aspirin-Salicylamide-Caffeine (BC FAST PAIN RELIEF) 650-195-33.3 MG PACK Take 1 packet by mouth 3 (three) times daily as needed (for menstration cramping/pain).    [provider]  HYDROcodone-acetaminophen (NORCO/VICODIN) 5-325 MG tablet Take 1 tablet by mouth every 4 (four) hours as needed for moderate pain. Patient not taking: Reported on 04/22/2017 04/18/17   Gae Dry, MD  ibuprofen (ADVIL,MOTRIN) 200 MG tablet Take 400 mg by mouth every 3 (three) hours as needed (for menstration cramping/pain).    [provider]  naproxen sodium (ANAPROX) 220 MG tablet Take 440 mg by mouth every 3 (three) hours as needed (for menstration cramping/pain).    [provider]    Allergies Oxycodone-acetaminophen  Family History  Problem Relation Age of Onset  . Diabetes Mother   . Hypertension Mother   . Cancer Mother   . Dysmenorrhea Sister     Social History Social History  Substance Use Topics  . Smoking status: Never Smoker  . Smokeless tobacco: Never Used  . Alcohol use No    Review of Systems Constitutional: Negative for fever/chills Eyes: No visual changes. ENT:  Positive for sore throat. Cardiovascular: Denies chest pain. Respiratory: Denies cough. Denies shortness of breath. Gastrointestinal: No abdominal pain.  No nausea, vomiting, diarrhea.. Musculoskeletal: Positive for generalized body aches. Skin: Negative for rash. Neurological: Negative for headaches.  ____________________________________________   PHYSICAL EXAM:  VITAL SIGNS: Patient Vitals for the past 24 hrs:  BP Temp Temp src Pulse Resp SpO2 Height Weight  05/08/17 2228 (!) 94/57 98.9 F (37.2 C) Oral 63 18 98 % - -  05/08/17 2028 109/62 (!) 102.2 F (39 C) Oral (!) 105 18 100 % - -  05/08/17 2027 - - - - - - 5\' 4"  (1.626 m) 56.2 kg (124 lb)    Constitutional: Alert and oriented. Well appearing and in no acute distress.  Head: Normocephalic and atraumatic. Eyes: Conjunctivae  are normal. PERRL. Normal extraocular movements.  Ears: Canals clear. TMs intact bilaterally. Nose: No congestion/rhinorrhea/epistaxis. Mouth/Throat: Mucous membranes are moist. Oropharynx erythematous with white exudate patches, tonsils symmetrical, bilaterally swollen. Neck: Supple.  Hematological/Lymphatic/Immunological: Cervical lymphadenopathy. Cardiovascular: Normal rate, regular rhythm. Normal distal pulses. Respiratory: Normal respiratory effort. No wheezes/rales/rhonchi. Lungs CTAB  Musculoskeletal: Nontender with normal range of motion in all extremities. Neurologic: Normal speech and language.  Skin:  Skin is warm, dry and intact. No rash noted. Psychiatric: Mood and affect are normal.  ____________________________________________   LABS (all labs ordered are listed, but only abnormal results are displayed)  Labs Reviewed  POCT RAPID STREP A - Abnormal; Notable for the following:       Result Value   Streptococcus, Group A Screen (Direct) POSITIVE (*)    All other components within normal limits   ____________________________________________  EKG none ____________________________________________  RADIOLOGY none ____________________________________________   PROCEDURES  Procedure(s) performed: no    Critical Care performed: no ____________________________________________   INITIAL IMPRESSION / ASSESSMENT AND PLAN / ED COURSE  Pertinent labs & imaging results that were available during my care of the patient were reviewed by me and considered in my medical decision making (see chart for details).   Patient presents to the emergency department sore throat, patchy tonsillar exudate, cervical lymphadenopathy, malaise, congestion, rhinorrhea, and fever. Symptoms are consistent with strep phyarigitis. Patient will be prescribed amoxicillin for about a coverage and advised to take over the counter cough and cold medication for symptoms relief. Patient will also be  encourage to rest, and rehydrate as a part of supportive care. Patient will be provide a prescription for additional regimen. Physical exam and vital signs are reassuring at this time.  Patient informed of clinical course, understand medical decision-making process, and agree with plan.  Patient was advised to follow up with PCP as needed and was also advised to return to the emergency department for symptoms that change or worsen.      If controlled substance prescribed during this visit, 12 month history viewed on the [facility] prior to issuing an initial prescription for Schedule II or III opiod. ____________________________________________   FINAL CLINICAL IMPRESSION(S) / ED DIAGNOSES  Final diagnoses:  Strep pharyngitis       NEW MEDICATIONS STARTED DURING THIS VISIT:  New Prescriptions   AMOXICILLIN (AMOXIL) 500 MG TABLET    Take 1 tablet (500 mg total) by mouth 2 (two) times daily.     Note:  This document was prepared using Dragon voice recognition software and may include unintentional dictation errors.    Jerolyn Shin, PA-C 05/08/17 2235    Earleen Newport, MD 05/08/17 [phone]

## 2017-05-08 NOTE — ED Triage Notes (Signed)
Pt arrives ambulatory to triage with c/o sore throat. Pt reports that she has "white spots" on her tonsils and a headache from the pain. Pt also reports that she has been told that her tonsils may need to come out if she continues getting infections. Pt is in NAD at this time.

## 2017-05-19 ENCOUNTER — Telehealth: Payer: Self-pay | Admitting: Obstetrics & Gynecology

## 2017-05-19 NOTE — Telephone Encounter (Signed)
I spoke to Jackelyn Poling and was able to direct her to the Media section of the chart where the Hysterectomy Statement is scanned in under 04/13/17 AMB Correspondance HYSTERECTOMY STATEMENT. Jackelyn Poling will forward the form to Medicaid.

## 2017-05-19 NOTE — Telephone Encounter (Signed)
Debbie with [facility] is calling in reguards to patients Hysterectomy on 04/13/2017. Medicaid has denied patients surgery and is needing a Copy of the Medicaid Hysterectomy Form Faxed to [phone]. Please call Debbie.

## 2017-05-31 ENCOUNTER — Ambulatory Visit: Payer: Medicaid Other | Admitting: Obstetrics & Gynecology

## 2017-06-20 ENCOUNTER — Encounter: Payer: Self-pay | Admitting: Obstetrics & Gynecology

## 2017-06-20 ENCOUNTER — Ambulatory Visit (INDEPENDENT_AMBULATORY_CARE_PROVIDER_SITE_OTHER): Payer: Medicaid Other | Admitting: Obstetrics & Gynecology

## 2017-06-20 VITALS — BP 100/60 | HR 79 | Ht 64.0 in | Wt 126.0 lb

## 2017-06-20 DIAGNOSIS — N920 Excessive and frequent menstruation with regular cycle: Secondary | ICD-10-CM

## 2017-06-20 DIAGNOSIS — B3731 Acute candidiasis of vulva and vagina: Secondary | ICD-10-CM | POA: Insufficient documentation

## 2017-06-20 DIAGNOSIS — D251 Intramural leiomyoma of uterus: Secondary | ICD-10-CM

## 2017-06-20 DIAGNOSIS — B373 Candidiasis of vulva and vagina: Secondary | ICD-10-CM | POA: Insufficient documentation

## 2017-06-20 MED ORDER — FLUCONAZOLE 150 MG PO TABS: 150.0000 mg | ORAL_TABLET | Freq: Once | ORAL | 1 refills | Status: AC

## 2017-06-20 NOTE — Patient Instructions (Signed)
Fluconazole tablets °What is this medicine? °FLUCONAZOLE (floo KON na zole) is an antifungal medicine. It is used to treat certain kinds of fungal or yeast infections. °This medicine may be used for other purposes; ask your health care provider or pharmacist if you have questions. °COMMON BRAND NAME(S): Diflucan °What should I tell my health care provider before I take this medicine? °They need to know if you have any of these conditions: °-history of irregular heart beat °-kidney disease °-an unusual or allergic reaction to fluconazole, other azole antifungals, medicines, foods, dyes, or preservatives °-pregnant or trying to get pregnant °-breast-feeding °How should I use this medicine? °Take this medicine by mouth. Follow the directions on the prescription label. Do not take your medicine more often than directed. °Talk to your pediatrician regarding the use of this medicine in children. Special care may be needed. This medicine has been used in children as young as 6 months of age. °Overdosage: If you think you have taken too much of this medicine contact a poison control center or emergency room at once. °NOTE: This medicine is only for you. Do not share this medicine with others. °What if I miss a dose? °If you miss a dose, take it as soon as you can. If it is almost time for your next dose, take only that dose. Do not take double or extra doses. °What may interact with this medicine? °Do not take this medicine with any of the following medications: °-astemizole °-certain medicines for irregular heart beat like dofetilide, dronedarone, quinidine °-cisapride °-erythromycin °-lomitapide °-other medicines that prolong the QT interval (cause an abnormal heart rhythm) °-pimozide °-terfenadine °-thioridazine °-tolvaptan °-ziprasidone °This medicine may also interact with the following medications: °-antiviral medicines for HIV or AIDS °-birth control pills °-certain antibiotics like rifabutin, rifampin °-certain  medicines for blood pressure like amlodipine, isradipine, felodipine, hydrochlorothiazide, losartan, nifedipine °-certain medicines for cancer like cyclophosphamide, vinblastine, vincristine °-certain medicines for cholesterol like atorvastatin, lovastatin, fluvastatin, simvastatin °-certain medicines for depression, anxiety, or psychotic disturbances like amitriptyline, midazolam, nortriptyline, triazolam °-certain medicines for diabetes like glipizide, glyburide, tolbutamide °-certain medicines for pain like alfentanil, fentanyl, methadone °-certain medicines for seizures like carbamazepine, phenytoin °-certain medicines that treat or prevent blood clots like warfarin °-halofantrine °-medicines that lower your chance of fighting infection like cyclosporine, prednisone, tacrolimus °-NSAIDS, medicines for pain and inflammation, like celecoxib, diclofenac, flurbiprofen, ibuprofen, meloxicam, naproxen °-other medicines for fungal infections °-sirolimus °-theophylline °-tofacitinib °This list may not describe all possible interactions. Give your health care provider a list of all the medicines, herbs, non-prescription drugs, or dietary supplements you use. Also tell them if you smoke, drink alcohol, or use illegal drugs. Some items may interact with your medicine. °What should I watch for while using this medicine? °Visit your doctor or health care professional for regular checkups. If you are taking this medicine for a long time you may need blood work. Tell your doctor if your symptoms do not improve. Some fungal infections need many weeks or months of treatment to cure. °Alcohol can increase possible damage to your liver. Avoid alcoholic drinks. °If you have a vaginal infection, do not have sex until you have finished your treatment. You can wear a sanitary napkin. Do not use tampons. Wear freshly washed cotton, not synthetic, panties. °What side effects may I notice from receiving this medicine? °Side effects that  you should report to your doctor or health care professional as soon as possible: °-allergic reactions like skin rash or itching, hives, swelling of the   lips, mouth, tongue, or throat °-dark urine °-feeling dizzy or faint °-irregular heartbeat or chest pain °-redness, blistering, peeling or loosening of the skin, including inside the mouth °-trouble breathing °-unusual bruising or bleeding °-vomiting °-yellowing of the eyes or skin °Side effects that usually do not require medical attention (report to your doctor or health care professional if they continue or are bothersome): °-changes in how food tastes °-diarrhea °-headache °-stomach upset or nausea °This list may not describe all possible side effects. Call your doctor for medical advice about side effects. You may report side effects to FDA at 1-800-FDA-1088. °Where should I keep my medicine? °Keep out of the reach of children. °Store at room temperature below 30 degrees C (86 degrees F). Throw away any medicine after the expiration date. °NOTE: This sheet is a summary. It may not cover all possible information. If you have questions about this medicine, talk to your doctor, pharmacist, or health care provider. °© 2018 [employer]vier/Gold Standard (2013-06-02 19:37:38) ° °

## 2017-06-20 NOTE — Progress Notes (Signed)
  HPI:      [name] is a 36 y.o. [phone] who LMP was Patient's last menstrual period was 04/03/2017., and has since had TLH BS for bleeding (for which she says she is much better from) presents today for a problem visit.  She complains of:  Vaginitis: Patient complains of an abnormal vaginal discharge for 1 day. Vaginal symptoms include discharge described as white and frothy.Vulvar symptoms include none.STI Risk: Very low risk of STD exposureDischarge described as: white and thick.Other associated symptoms: none.Menstrual pattern: She had been bleeding none since having TLH done. Contraception: status post hysterectomy  PMHx: She  has no past medical history on file. Also,  has a past surgical history that includes Tubal ligation; Laparoscopic hysterectomy (Bilateral, 04/12/2017); and Cystoscopy (04/12/2017)., family history includes Cancer in her mother; Diabetes in her mother; Dysmenorrhea in her sister; Hypertension in her mother.,  reports that she has never smoked. She has never used smokeless tobacco. She reports that she does not drink alcohol or use drugs.  She has a current medication list which includes the following prescription(s): acetaminophen, acetaminophen-caff-pyrilamine, aspirin-salicylamide-caffeine, fluconazole, hydrocodone-acetaminophen, ibuprofen, and naproxen sodium. Also, is allergic to oxycodone-acetaminophen.  Review of Systems  Constitutional: Negative for chills, fever and malaise/fatigue.  HENT: Negative for congestion, sinus pain and sore throat.   Eyes: Negative for blurred vision and pain.  Respiratory: Negative for cough and wheezing.   Cardiovascular: Negative for chest pain and leg swelling.  Gastrointestinal: Negative for abdominal pain, constipation, diarrhea, heartburn, nausea and vomiting.  Genitourinary: Negative for dysuria, frequency, hematuria and urgency.  Musculoskeletal: Negative for back pain, joint pain, myalgias and neck pain.  Skin:  Negative for itching and rash.  Neurological: Negative for dizziness, tremors and weakness.  Endo/Heme/Allergies: Does not bruise/bleed easily.  Psychiatric/Behavioral: Negative for depression. The patient is not nervous/anxious and does not have insomnia.    Objective: BP 100/60   Pulse 79   Ht 5\' 4"  (1.626 m)   Wt 126 lb (57.2 kg)   LMP 04/03/2017   BMI 21.63 kg/m  Physical Exam  Constitutional: She is oriented to person, place, and time. She appears well-developed and well-nourished. No distress.  Genitourinary: Vagina normal. Pelvic exam was performed with patient supine. There is no rash, tenderness or lesion on the right labia. There is no rash, tenderness or lesion on the left labia. No erythema or bleeding in the vagina.  Genitourinary Comments: Cuff intact/ no lesions  Absent uterus and cervix  Abdominal: Soft. She exhibits no distension. There is no tenderness.  Musculoskeletal: Normal range of motion.  Neurological: She is alert and oriented to person, place, and time. No cranial nerve deficit.  Skin: Skin is warm and dry.  Psychiatric: She has a normal mood and affect.  WET PREP:   positive hyphae  Findings are consistent with monilia vaginitis  ASSESSMENT/PLAN:    Problem List Items Addressed This Visit      Genitourinary   Intramural leiomyoma of uterus - Primary   Yeast vaginitis   Relevant Medications   fluconazole (DIFLUCAN) 150 MG tablet     Other   Menorrhagia with regular cycle  Resolved menorrhagia w hysterectomy         Barnett Applebaum, MD, [place], [facility] 06/20/2017  4:34 PM

## 2017-07-04 ENCOUNTER — Encounter: Payer: Self-pay | Admitting: Anesthesiology

## 2017-07-04 ENCOUNTER — Encounter: Payer: Self-pay | Admitting: *Deleted

## 2017-07-04 NOTE — Discharge Instructions (Signed)
T & A INSTRUCTION SHEET - [facility] °[facility] ° °CREIGHTON VAUGHT, MD °PAUL H. JUENGEL, MD  °P. SCOTT BENNETT °CHAPMAN MCQUEEN, MD ° °[address] [place], [place] [address] TEL. [phone] °[address] [place] [address] [phone] ° °INFORMATION SHEET FOR A TONSILLECTOMY AND ADENDOIDECTOMY ° °About Your Tonsils and Adenoids ° The tonsils and adenoids are normal body tissues that are part of our immune system.  They normally help to protect us against diseases that may enter our mouth and nose.  However, sometimes the tonsils and/or adenoids become too large and obstruct our breathing, especially at night. °  ° If either of these things happen it helps to remove the tonsils and adenoids in order to become healthier. The operation to remove the tonsils and adenoids is called a tonsillectomy and adenoidectomy. ° °The Location of Your Tonsils and Adenoids ° The tonsils are located in the back of the throat on both side and sit in a cradle of muscles. The adenoids are located in the roof of the mouth, behind the nose, and closely associated with the opening of the Eustachian tube to the ear. ° °Surgery on Tonsils and Adenoids ° A tonsillectomy and adenoidectomy is a short operation which takes about thirty minutes.  This includes being put to sleep and being awakened.  Tonsillectomies and adenoidectomies are performed at [facility] and may require observation period in the recovery room prior to going home. ° °Following the Operation for a Tonsillectomy ° A cautery machine is used to control bleeding.  Bleeding from a tonsillectomy and adenoidectomy is minimal and postoperatively the risk of bleeding is approximately four percent, although this rarely life threatening. ° ° ° °After your tonsillectomy and adenoidectomy post-op care at home: ° °1. Our patients are able to go home the same day.  You may be given prescriptions for pain  medications and antibiotics, if indicated. °2. It is extremely important to remember that fluid intake is of utmost importance after a tonsillectomy.  The amount that you drink must be maintained in the postoperative period.  A good indication of whether a child is getting enough fluid is whether his/her urine output is constant.  As long as children are urinating or wetting their diaper every 6 - 8 hours this is usually enough fluid intake.   °3. Although rare, this is a risk of some bleeding in the first ten days after surgery.  This is usually occurs between day five and nine postoperatively.  This risk of bleeding is approximately four percent.  If you or your child should have any bleeding you should remain calm and notify our office or go directly to the Emergency Room at [facility] where they will contact us. Our doctors are available seven days a week for notification.  We recommend sitting up quietly in a chair, place an ice pack on the front of the neck and spitting out the blood gently until we are able to contact you.  Adults should gargle gently with ice water and this may help stop the bleeding.  If the bleeding does not stop after a short time, i.e. 10 to 15 minutes, or seems to be increasing again, please contact us or go to the hospital.   °4. It is common for the pain to be worse at 5 - 7 days postoperatively.  This occurs because the “scab” is peeling off and the mucous membrane (skin of   the throat) is growing back where the tonsils were.   °5. It is common for a low-grade fever, less than 102, during the first week after a tonsillectomy and adenoidectomy.  It is usually due to not drinking enough liquids, and we suggest your use liquid Tylenol or the pain medicine with Tylenol prescribed in order to keep your temperature below 102.  Please follow the directions on the back of the bottle. °6. Do not take aspirin or any products that contain aspirin such as Bufferin, Anacin,  Ecotrin, aspirin gum, Goodies, BC headache powders, etc., after a T&A because it can promote bleeding.  Please check with our office before administering any other medication that may been prescribed by other doctors during the two week post-operative period. °7. If you happen to look in the mirror or into your child’s mouth you will see white/gray patches on the back of the throat.  This is what a scab looks like in the mouth and is normal after having a T&A.  It will disappear once the tonsil area heals completely. However, it may cause a noticeable odor, and this too will disappear with time.     °8. You or your child may experience ear pain after having a T&A.  This is called referred pain and comes from the throat, but it is felt in the ears.  Ear pain is quite common and expected.  It will usually go away after ten days.  There is usually nothing wrong with the ears, and it is primarily due to the healing area stimulating the nerve to the ear that runs along the side of the throat.  Use either the prescribed pain medicine or Tylenol as needed.  °9. The throat tissues after a tonsillectomy are obviously sensitive.  Smoking around children who have had a tonsillectomy significantly increases the risk of bleeding.  DO NOT SMOKE!  ° °General Anesthesia, Adult, Care After °These instructions provide you with information about caring for yourself after your procedure. Your health care provider may also give you more specific instructions. Your treatment has been planned according to current medical practices, but problems sometimes occur. Call your health care provider if you have any problems or questions after your procedure. °What can I expect after the procedure? °After the procedure, it is common to have: °· Vomiting. °· A sore throat. °· Mental slowness. ° °It is common to feel: °· Nauseous. °· Cold or shivery. °· Sleepy. °· Tired. °· Sore or achy, even in parts of your body where you did not have  surgery. ° °Follow these instructions at home: °For at least 24 hours after the procedure: °· Do not: °? Participate in activities where you could fall or become injured. °? Drive. °? Use heavy machinery. °? Drink alcohol. °? Take sleeping pills or medicines that cause drowsiness. °? Make important decisions or sign legal documents. °? Take care of children on your own. °· Rest. °Eating and drinking °· If you vomit, drink water, juice, or soup when you can drink without vomiting. °· Drink enough fluid to keep your urine clear or pale yellow. °· Make sure you have little or no nausea before eating solid foods. °· Follow the diet recommended by your health care provider. °General instructions °· Have a responsible adult stay with you until you are awake and alert. °· Return to your normal activities as told by your health care provider. Ask your health care provider what activities are safe for you. °· Take over-the-counter and   prescription medicines only as told by your health care provider. °· If you smoke, do not smoke without supervision. °· Keep all follow-up visits as told by your health care provider. This is important. °Contact a health care provider if: °· You continue to have nausea or vomiting at home, and medicines are not helpful. °· You cannot drink fluids or start eating again. °· You cannot urinate after 8-12 hours. °· You develop a skin rash. °· You have fever. °· You have increasing redness at the site of your procedure. °Get help right away if: °· You have difficulty breathing. °· You have chest pain. °· You have unexpected bleeding. °· You feel that you are having a life-threatening or urgent problem. °This information is not intended to replace advice given to you by your health care provider. Make sure you discuss any questions you have with your health care provider. °Document Released: 01/31/2001 Document Revised: 03/29/2016 Document Reviewed: 10/09/2015 °Elsevier Interactive Patient Education  © 2018 [employer] ° °

## 2017-07-05 ENCOUNTER — Ambulatory Visit: Admission: RE | Admit: 2017-07-05 | Payer: Medicaid Other | Source: Ambulatory Visit | Admitting: Otolaryngology

## 2017-07-05 HISTORY — DX: Chronic tonsillitis: J35.01

## 2017-07-05 SURGERY — TONSILLECTOMY
Anesthesia: General

## 2017-10-09 ENCOUNTER — Other Ambulatory Visit: Payer: Self-pay

## 2017-10-09 ENCOUNTER — Emergency Department
Admission: EM | Admit: 2017-10-09 | Discharge: 2017-10-09 | Disposition: A | Discharge status: Home / Self Care | Payer: Medicaid Other | Attending: Emergency Medicine | Admitting: Emergency Medicine

## 2017-10-09 ENCOUNTER — Encounter: Payer: Self-pay | Admitting: Emergency Medicine

## 2017-10-09 DIAGNOSIS — L03116 Cellulitis of left lower limb: Secondary | ICD-10-CM | POA: Diagnosis not present

## 2017-10-09 DIAGNOSIS — M79605 Pain in left leg: Secondary | ICD-10-CM | POA: Diagnosis present

## 2017-10-09 MED ORDER — SULFAMETHOXAZOLE-TRIMETHOPRIM 800-160 MG PO TABS
1.0000 | ORAL_TABLET | Freq: Once | ORAL | Status: AC
  Administered 2017-10-09: 1 via ORAL

## 2017-10-09 MED ORDER — SULFAMETHOXAZOLE-TRIMETHOPRIM 800-160 MG PO TABS: 1.0000 | ORAL_TABLET | Freq: Two times a day (BID) | ORAL | 0 refills | Status: AC

## 2017-10-09 MED ORDER — SULFAMETHOXAZOLE-TRIMETHOPRIM 800-160 MG PO TABS
ORAL_TABLET | ORAL | Status: AC
  Filled 2017-10-09: qty 1

## 2017-10-09 NOTE — ED Provider Notes (Signed)
[facility] Emergency Department Provider Note  ____________________________________________  Time seen: Approximately 7:36 PM  I have reviewed the triage vital signs and the nursing notes.   HISTORY  Chief Complaint Leg Pain    HPI Bridget Wong is a 36 y.o. female presents to the emergency department with a 3 cm x 3 cm region of cellulitis surrounding a hair follicle that patient that patient first noticed last night.  Patient denies recent travel, prolonged immobilization, recent surgery or history of DVT.  Patient reports a history of folliculitis in the past.  She denies fever or chills.  No alleviating measures been attempted.   Past Medical History:  Diagnosis Date  . Chronic tonsillitis     Patient Active Problem List   Diagnosis Date Noted  . Yeast vaginitis 06/20/2017  . Fibroids 04/12/2017  . Intramural leiomyoma of uterus 04/08/2017  . Menorrhagia with regular cycle 04/08/2017    Past Surgical History:  Procedure Laterality Date  . CYSTOSCOPY  04/12/2017   Procedure: CYSTOSCOPY;  Surgeon: Gae Dry, MD;  Location: [facility] ORS;  Service: Gynecology;;  . LAPAROSCOPIC HYSTERECTOMY Bilateral 04/12/2017   Procedure: HYSTERECTOMY TOTAL LAPAROSCOPIC WITH BILATERAL SALPINGECTOMY;  Surgeon: Gae Dry, MD;  Location: [facility] ORS;  Service: Gynecology;  Laterality: Bilateral;  . TUBAL LIGATION      Prior to Admission medications   Medication Sig Start Date End Date Taking? Authorizing Provider  acetaminophen (TYLENOL) 500 MG tablet Take 1,000 mg by mouth every 3 (three) hours as needed (for menstration cramping/pain).    [provider]  Acetaminophen-Caff-Pyrilamine (MIDOL COMPLETE) 500-60-15 MG TABS Take 2 tablets by mouth every 3 (three) hours as needed (for menstration cramping/pain).    [provider]  Aspirin-Salicylamide-Caffeine (BC FAST PAIN RELIEF) 650-195-33.3 MG PACK Take 1 packet by mouth 3 (three) times  daily as needed (for menstration cramping/pain).    [provider]  HYDROcodone-acetaminophen (NORCO/VICODIN) 5-325 MG tablet Take 1 tablet by mouth every 4 (four) hours as needed for moderate pain. Patient not taking: Reported on 04/22/2017 04/18/17   Gae Dry, MD  ibuprofen (ADVIL,MOTRIN) 200 MG tablet Take 400 mg by mouth every 3 (three) hours as needed (for menstration cramping/pain).    [provider]  naproxen sodium (ANAPROX) 220 MG tablet Take 440 mg by mouth every 3 (three) hours as needed (for menstration cramping/pain).    [provider]  sulfamethoxazole-trimethoprim (BACTRIM DS,SEPTRA DS) 800-160 MG tablet Take 1 tablet by mouth 2 (two) times daily for 7 days. 10/09/17 10/16/17  Lannie Fields, PA-C    Allergies Oxycodone-acetaminophen  Family History  Problem Relation Age of Onset  . Diabetes Mother   . Hypertension Mother   . Cancer Mother   . Dysmenorrhea Sister     Social History Social History   Tobacco Use  . Smoking status: Never Smoker  . Smokeless tobacco: Never Used  Substance Use Topics  . Alcohol use: No  . Drug use: No     Review of Systems  Constitutional: No fever/chills Eyes: No visual changes. No discharge ENT: No upper respiratory complaints. Cardiovascular: no chest pain. Respiratory: no cough. No SOB. Gastrointestinal: No abdominal pain.  No nausea, no vomiting.  No diarrhea.  No constipation. Musculoskeletal: Negative for musculoskeletal pain. Skin: Patient has cellulitis.  Neurological: Negative for headaches, focal weakness or numbness.   ____________________________________________   PHYSICAL EXAM:  VITAL SIGNS: ED Triage Vitals [10/09/17 1818]  Enc Vitals Group     BP  116/70     Pulse Rate 60     Resp 16     Temp 98.6 F (37 C)     Temp Source Oral     SpO2 100 %     Weight      Height      Head Circumference      Peak Flow      Pain Score 8     Pain Loc      Pain Edu?      Excl.  in [place]?      Constitutional: Alert and oriented. Well appearing and in no acute distress. Eyes: Conjunctivae are normal. PERRL. EOMI. Head: Atraumatic. Cardiovascular: Normal rate, regular rhythm. Normal S1 and S2.  Good peripheral circulation. Respiratory: Normal respiratory effort without tachypnea or retractions. Lungs CTAB. Good air entry to the bases with no decreased or absent breath sounds. Musculoskeletal: Full range of motion to all extremities. No gross deformities appreciated. Neurologic:  Normal speech and language. No gross focal neurologic deficits are appreciated.  Skin: Patient has a 3 cm x 3 cm region of circumferential cellulitis at the medial aspect of the left ankle.  Cellulitis is surrounding a hair follicle.  Palpable dorsalis pedis pulse bilaterally and symmetrically. Psychiatric: Mood and affect are normal. Speech and behavior are normal. Patient exhibits appropriate insight and judgement.   ____________________________________________   LABS (all labs ordered are listed, but only abnormal results are displayed)  Labs Reviewed - No data to display ____________________________________________  EKG   ____________________________________________  RADIOLOGY   No results found.  ____________________________________________    PROCEDURES  Procedure(s) performed:    Procedures    Medications - No data to display   ____________________________________________   INITIAL IMPRESSION / ASSESSMENT AND PLAN / ED COURSE  Pertinent labs & imaging results that were available during my care of the patient were reviewed by me and considered in my medical decision making (see chart for details).  Review of the [place] CSRS was performed in accordance of the [place] prior to dispensing any controlled drugs.     Assessment and plan Cellulitis Patient presents to the emergency department with cellulitis of the left ankle.  Patient was discharged with Bactrim and  advised to follow-up with primary care as needed.  Vital signs were reassuring prior to discharge.  All patient questions were answered.    ____________________________________________  FINAL CLINICAL IMPRESSION(S) / ED DIAGNOSES  Final diagnoses:  Cellulitis of left lower extremity      NEW MEDICATIONS STARTED DURING THIS VISIT:  ED Discharge Orders        Ordered    sulfamethoxazole-trimethoprim (BACTRIM DS,SEPTRA DS) 800-160 MG tablet  2 times daily     10/09/17 1934          This chart was dictated using voice recognition software/Dragon. Despite best efforts to proofread, errors can occur which can change the meaning. Any change was purely unintentional.    Lannie Fields, PA-C 10/09/17 1944    Carrie Mew, MD 10/09/17 2312

## 2017-10-09 NOTE — ED Triage Notes (Signed)
Pt states that she has been having lower left leg pain with redness and soreness. Pt states that this started last night. Pt states that she thinks she may have been bitten by something. Pt in NAD at this time.

## 2018-04-04 DIAGNOSIS — R1030 Lower abdominal pain, unspecified: Secondary | ICD-10-CM | POA: Insufficient documentation

## 2018-04-05 ENCOUNTER — Encounter: Payer: Self-pay | Admitting: Emergency Medicine

## 2018-04-05 ENCOUNTER — Other Ambulatory Visit: Payer: Self-pay

## 2018-04-05 ENCOUNTER — Emergency Department: Payer: Medicaid Other

## 2018-04-05 ENCOUNTER — Emergency Department
Admission: EM | Admit: 2018-04-05 | Discharge: 2018-04-05 | Disposition: A | Discharge status: Home / Self Care | Payer: Medicaid Other | Attending: Emergency Medicine | Admitting: Emergency Medicine

## 2018-04-05 DIAGNOSIS — R1030 Lower abdominal pain, unspecified: Secondary | ICD-10-CM

## 2018-04-05 LAB — URINALYSIS, COMPLETE (UACMP) WITH MICROSCOPIC
BACTERIA UA: NONE SEEN
Bilirubin Urine: NEGATIVE
Glucose, UA: NEGATIVE mg/dL
Hgb urine dipstick: NEGATIVE
KETONES UR: NEGATIVE mg/dL
Leukocytes, UA: NEGATIVE
Nitrite: NEGATIVE
PROTEIN: 30 mg/dL — AB
Specific Gravity, Urine: 1.041 — ABNORMAL HIGH (ref 1.005–1.030)
pH: 6 (ref 5.0–8.0)

## 2018-04-05 LAB — CBC
HEMATOCRIT: 35.3 % (ref 35.0–47.0)
HEMOGLOBIN: 12 g/dL (ref 12.0–16.0)
MCH: 31.8 pg (ref 26.0–34.0)
MCHC: 34.1 g/dL (ref 32.0–36.0)
MCV: 93.1 fL (ref 80.0–100.0)
Platelets: 236 10*3/uL (ref 150–440)
RBC: 3.79 MIL/uL — AB (ref 3.80–5.20)
RDW: 12.3 % (ref 11.5–14.5)
WBC: 3.8 10*3/uL (ref 3.6–11.0)

## 2018-04-05 LAB — COMPREHENSIVE METABOLIC PANEL
ALBUMIN: 4.1 g/dL (ref 3.5–5.0)
ALK PHOS: 67 U/L (ref 38–126)
ALT: 12 U/L — ABNORMAL LOW (ref 14–54)
ANION GAP: 6 (ref 5–15)
AST: 16 U/L (ref 15–41)
BUN: 17 mg/dL (ref 6–20)
CALCIUM: 8.6 mg/dL — AB (ref 8.9–10.3)
CHLORIDE: 107 mmol/L (ref 101–111)
CO2: 25 mmol/L (ref 22–32)
Creatinine, Ser: 0.49 mg/dL (ref 0.44–1.00)
GFR calc Af Amer: >60 mL/min (ref >60)
GFR calc non Af Amer: >60 mL/min (ref >60)
GLUCOSE: 96 mg/dL (ref 65–99)
POTASSIUM: 4.2 mmol/L (ref 3.5–5.1)
SODIUM: 138 mmol/L (ref 135–145)
Total Bilirubin: 0.8 mg/dL (ref 0.3–1.2)
Total Protein: 7.5 g/dL (ref 6.5–8.1)

## 2018-04-05 LAB — PREGNANCY, URINE: PREG TEST UR: NEGATIVE

## 2018-04-05 LAB — LIPASE, BLOOD: LIPASE: 31 U/L (ref 11–51)

## 2018-04-05 MED ORDER — IBUPROFEN 400 MG PO TABS
400.0000 mg | ORAL_TABLET | Freq: Once | ORAL | Status: AC | PRN
  Administered 2018-04-05: 400 mg via ORAL
  Filled 2018-04-05: qty 1

## 2018-04-05 MED ORDER — KETOROLAC TROMETHAMINE 30 MG/ML IJ SOLN
30.0000 mg | Freq: Once | INTRAMUSCULAR | Status: AC
  Administered 2018-04-05: 30 mg via INTRAVENOUS
  Filled 2018-04-05: qty 1

## 2018-04-05 MED ORDER — TRAMADOL HCL 50 MG PO TABS: 50.0000 mg | ORAL_TABLET | Freq: Four times a day (QID) | ORAL | 0 refills | Status: DC | PRN

## 2018-04-05 MED ORDER — IOPAMIDOL (ISOVUE-300) INJECTION 61%
80.0000 mL | Freq: Once | INTRAVENOUS | Status: AC | PRN
  Administered 2018-04-05: 80 mL via INTRAVENOUS

## 2018-04-05 NOTE — ED Triage Notes (Signed)
Pt reports lower abd pain for the last few weeks but denies vaginal bleeding. Does report she was recently treated for BV. Pt also reports hx of ovarian cyst and fibroids.

## 2018-04-05 NOTE — Discharge Instructions (Signed)
Your evaluation this evening is unremarkable.  Please follow-up with your primary care physician as well as OB/GYN for further evaluation of your lower abdominal pain.

## 2018-04-05 NOTE — ED Provider Notes (Signed)
[facility] Emergency Department Provider Note   ____________________________________________   First MD Initiated Contact with Patient 04/05/18 0422     (approximate)  I have reviewed the triage vital signs and the nursing notes.   HISTORY  Chief Complaint Abdominal Pain    HPI Bridget Wong is a 37 y.o. female who comes into the hospital today with some bad pain in her lower abdomen.  The patient states that this is been going on for 2 weeks but is been getting worse.  She has been taking Tylenol but has not helped.  She has had this pain before.  Last year she had this pain and it was discovered that she had fibroid tumors and a cyst on her ovary.  The patient had a surgery done by a physician at [facility] clinic and they removed her uterus.  The patient denies any vaginal bleeding.  She rates her pain a 10 out of 10 in intensity.  The patient states that 2 weeks ago she went to her primary care physician and was diagnosed with bacterial vaginosis and yeast.  She was treated by her primary care physician.  The patient is here again for further evaluation of her symptoms.  Past Medical History:  Diagnosis Date  . Chronic tonsillitis     Patient Active Problem List   Diagnosis Date Noted  . Yeast vaginitis 06/20/2017  . Fibroids 04/12/2017  . Intramural leiomyoma of uterus 04/08/2017  . Menorrhagia with regular cycle 04/08/2017    Past Surgical History:  Procedure Laterality Date  . CYSTOSCOPY  04/12/2017   Procedure: CYSTOSCOPY;  Surgeon: Gae Dry, MD;  Location: [facility] ORS;  Service: Gynecology;;  . LAPAROSCOPIC HYSTERECTOMY Bilateral 04/12/2017   Procedure: HYSTERECTOMY TOTAL LAPAROSCOPIC WITH BILATERAL SALPINGECTOMY;  Surgeon: Gae Dry, MD;  Location: [facility] ORS;  Service: Gynecology;  Laterality: Bilateral;  . TUBAL LIGATION      Prior to Admission medications   Medication Sig Start Date End Date Taking? Authorizing Provider    acetaminophen (TYLENOL) 500 MG tablet Take 1,000 mg by mouth every 3 (three) hours as needed (for menstration cramping/pain).    [provider]  Acetaminophen-Caff-Pyrilamine (MIDOL COMPLETE) 500-60-15 MG TABS Take 2 tablets by mouth every 3 (three) hours as needed (for menstration cramping/pain).    [provider]  Aspirin-Salicylamide-Caffeine (BC FAST PAIN RELIEF) 650-195-33.3 MG PACK Take 1 packet by mouth 3 (three) times daily as needed (for menstration cramping/pain).    [provider]  HYDROcodone-acetaminophen (NORCO/VICODIN) 5-325 MG tablet Take 1 tablet by mouth every 4 (four) hours as needed for moderate pain. Patient not taking: Reported on 04/22/2017 04/18/17   Gae Dry, MD  ibuprofen (ADVIL,MOTRIN) 200 MG tablet Take 400 mg by mouth every 3 (three) hours as needed (for menstration cramping/pain).    [provider]  naproxen sodium (ANAPROX) 220 MG tablet Take 440 mg by mouth every 3 (three) hours as needed (for menstration cramping/pain).    [provider]  traMADol (ULTRAM) 50 MG tablet Take 1 tablet (50 mg total) by mouth every 6 (six) hours as needed. 04/05/18   Loney Hering, MD    Allergies Oxycodone-acetaminophen  Family History  Problem Relation Age of Onset  . Diabetes Mother   . Hypertension Mother   . Cancer Mother   . Dysmenorrhea Sister     Social History Social History   Tobacco Use  . Smoking status: Never Smoker  . Smokeless tobacco: Never Used  Substance Use Topics  . Alcohol use: No  . Drug use: No    Review of Systems  Constitutional: No fever/chills Eyes: No visual changes. ENT: No sore throat. Cardiovascular: Denies chest pain. Respiratory: Denies shortness of breath. Gastrointestinal:  abdominal pain.  No nausea, no vomiting.  Mild Genitourinary: Negative for dysuria. Musculoskeletal: Negative for back pain. Skin: Negative for rash. Neurological: Negative for headaches, focal  weakness or numbness.   ____________________________________________   PHYSICAL EXAM:  VITAL SIGNS: ED Triage Vitals  Enc Vitals Group     BP 04/04/18 2355 94/63     Pulse Rate 04/04/18 2355 63     Resp 04/04/18 2355 18     Temp 04/04/18 2355 98.3 F (36.8 C)     Temp Source 04/04/18 2355 Oral     SpO2 04/04/18 2355 100 %     Weight 04/05/18 0041 134 lb (60.8 kg)     Height 04/05/18 0041 5\' 4"  (1.626 m)     Head Circumference --      Peak Flow --      Pain Score 04/05/18 0041 10     Pain Loc --      Pain Edu? --      Excl. in [place]? --     Constitutional: Alert and oriented. Well appearing and in mild distress.  The patient was sleeping comfortably when I walked into the room and did fall asleep multiple times during the history. Eyes: Conjunctivae are normal. PERRL. EOMI. Head: Atraumatic. Nose: No congestion/rhinnorhea. Mouth/Throat: Mucous membranes are moist.  Oropharynx non-erythematous. Cardiovascular: Normal rate, regular rhythm. Grossly normal heart sounds.  Good peripheral circulation. Respiratory: Normal respiratory effort.  No retractions. Lungs CTAB. Gastrointestinal: Soft with some mild lower abdominal pain worse on the left . No distention.  Positive bowel sounds Musculoskeletal: No lower extremity tenderness nor edema.   Neurologic:  Normal speech and language.  Skin:  Skin is warm, dry and intact.  Psychiatric: Mood and affect are normal. .  ____________________________________________   LABS (all labs ordered are listed, but only abnormal results are displayed)  Labs Reviewed  COMPREHENSIVE METABOLIC PANEL - Abnormal; Notable for the following components:      Result Value   Calcium 8.6 (*)    ALT 12 (*)    All other components within normal limits  CBC - Abnormal; Notable for the following components:   RBC 3.79 (*)    All other components within normal limits  URINALYSIS, COMPLETE (UACMP) WITH MICROSCOPIC - Abnormal; Notable for the following  components:   Color, Urine YELLOW (*)    APPearance HAZY (*)    Specific Gravity, Urine 1.041 (*)    Protein, ur 30 (*)    All other components within normal limits  LIPASE, BLOOD  PREGNANCY, URINE   ____________________________________________  EKG  none ____________________________________________  RADIOLOGY  ED MD interpretation:  CT abd and pelvis: No acute abnormality seen within the abdomen or pelvis  Official radiology report(s): Ct Abdomen Pelvis W Contrast  Result Date: 04/05/2018 CLINICAL DATA:  Subacute onset of lower abdominal pain. EXAM: CT ABDOMEN AND PELVIS WITH CONTRAST TECHNIQUE: Multidetector CT imaging of the abdomen and pelvis was performed using the standard protocol following bolus administration of intravenous contrast. CONTRAST:  53mL ISOVUE-300 IOPAMIDOL (ISOVUE-300) INJECTION 61% COMPARISON:  Pelvic ultrasound performed 03/12/2017 FINDINGS: Lower chest: The visualized lung bases are grossly clear. The visualized portions of the mediastinum are unremarkable. Hepatobiliary: The liver is unremarkable in appearance. The gallbladder is unremarkable in  appearance. The common bile duct remains normal in caliber. Pancreas: The pancreas is within normal limits. Spleen: The spleen is unremarkable in appearance. Adrenals/Urinary Tract: The adrenal glands are unremarkable in appearance. The kidneys are within normal limits. There is no evidence of hydronephrosis. No renal or ureteral stones are identified. No perinephric stranding is seen. Stomach/Bowel: The stomach is unremarkable in appearance. The small bowel is within normal limits. The appendix is normal in caliber, without evidence of appendicitis. The colon is unremarkable in appearance. Vascular/Lymphatic: The abdominal aorta is unremarkable in appearance. The inferior vena cava is grossly unremarkable. No retroperitoneal lymphadenopathy is seen. No pelvic sidewall lymphadenopathy is identified. Reproductive: The bladder  is decompressed and not well assessed. The patient is status post hysterectomy. The ovaries are relatively symmetric. No suspicious adnexal masses are seen. Other: No additional soft tissue abnormalities are seen. Musculoskeletal: No acute osseous abnormalities are identified. The visualized musculature is unremarkable in appearance. IMPRESSION: No acute abnormality seen within the abdomen or pelvis. Electronically Signed   By: Garald Balding M.D.   On: 04/05/2018 05:24    ____________________________________________   PROCEDURES  Procedure(s) performed: None  Procedures  Critical Care performed: No  ____________________________________________   INITIAL IMPRESSION / ASSESSMENT AND PLAN / ED COURSE  As part of my medical decision making, I reviewed the following data within the electronic MEDICAL RECORD NUMBER Notes from prior ED visits and [place] Controlled Substance Database   This is a 37 year old female who comes into the hospital today with some lower abdominal pain.  My differential diagnosis includes diverticulitis, constipation, colitis, ovarian cyst.  We did check some blood work on the patient and it was unremarkable.  The patient had a urinalysis which was also negative.  I sent the patient for a CT scan which did not show any cause of the patient's pain and she did receive a dose of Toradol.  As the patient's evaluation is negative I feel that she should follow back up with her OB/GYN for further evaluation of this lower abdominal pain.  The patient understands the plan will be discharged home to follow-up.      ____________________________________________   FINAL CLINICAL IMPRESSION(S) / ED DIAGNOSES  Final diagnoses:  Lower abdominal pain     ED Discharge Orders        Ordered    traMADol (ULTRAM) 50 MG tablet  Every 6 hours PRN     04/05/18 0611       Note:  This document was prepared using Dragon voice recognition software and may include unintentional  dictation errors.    Loney Hering, MD 04/05/18 [id number]

## 2018-04-06 ENCOUNTER — Encounter: Payer: Self-pay | Admitting: Obstetrics & Gynecology

## 2018-04-06 ENCOUNTER — Ambulatory Visit (INDEPENDENT_AMBULATORY_CARE_PROVIDER_SITE_OTHER): Payer: BLUE CROSS/BLUE SHIELD | Admitting: Obstetrics & Gynecology

## 2018-04-06 VITALS — BP 100/60 | Ht 64.0 in | Wt 135.0 lb

## 2018-04-06 DIAGNOSIS — R1032 Left lower quadrant pain: Secondary | ICD-10-CM | POA: Diagnosis not present

## 2018-04-06 MED ORDER — MELOXICAM 7.5 MG PO TABS: 7.5000 mg | ORAL_TABLET | Freq: Every day | ORAL | 1 refills | Status: DC

## 2018-04-06 NOTE — Patient Instructions (Signed)
Meloxicam tablets What is this medicine? MELOXICAM (mel OX i cam) is a non-steroidal anti-inflammatory drug (NSAID). It is used to reduce swelling and to treat pain. It may be used for osteoarthritis, rheumatoid arthritis, or juvenile rheumatoid arthritis. This medicine may be used for other purposes; ask your health care provider or pharmacist if you have questions. COMMON BRAND NAME(S): Mobic What should I tell my health care provider before I take this medicine? They need to know if you have any of these conditions: -bleeding disorders -cigarette smoker -coronary artery bypass graft (CABG) surgery within the past 2 weeks -drink more than 3 alcohol-containing drinks per day -heart disease -high blood pressure -history of stomach bleeding -kidney disease -liver disease -lung or breathing disease, like asthma -stomach or intestine problems -an unusual or allergic reaction to meloxicam, aspirin, other NSAIDs, other medicines, foods, dyes, or preservatives -pregnant or trying to get pregnant -breast-feeding How should I use this medicine? Take this medicine by mouth with a full glass of water. Follow the directions on the prescription label. You can take it with or without food. If it upsets your stomach, take it with food. Take your medicine at regular intervals. Do not take it more often than directed. Do not stop taking except on your doctor's advice. A special MedGuide will be given to you by the pharmacist with each prescription and refill. Be sure to read this information carefully each time. Talk to your pediatrician regarding the use of this medicine in children. While this drug may be prescribed for selected conditions, precautions do apply. Patients over 65 years old may have a stronger reaction and need a smaller dose. Overdosage: If you think you have taken too much of this medicine contact a poison control center or emergency room at once. NOTE: This medicine is only for you. Do  not share this medicine with others. What if I miss a dose? If you miss a dose, take it as soon as you can. If it is almost time for your next dose, take only that dose. Do not take double or extra doses. What may interact with this medicine? Do not take this medicine with any of the following medications: -cidofovir -ketorolac This medicine may also interact with the following medications: -aspirin and aspirin-like medicines -certain medicines for blood pressure, heart disease, irregular heart beat -certain medicines for depression, anxiety, or psychotic disturbances -certain medicines that treat or prevent blood clots like warfarin, enoxaparin, dalteparin, apixaban, dabigatran, rivaroxaban -cyclosporine -digoxin -diuretics -methotrexate -other NSAIDs, medicines for pain and inflammation, like ibuprofen and naproxen -pemetrexed This list may not describe all possible interactions. Give your health care provider a list of all the medicines, herbs, non-prescription drugs, or dietary supplements you use. Also tell them if you smoke, drink alcohol, or use illegal drugs. Some items may interact with your medicine. What should I watch for while using this medicine? Tell your doctor or healthcare professional if your symptoms do not start to get better or if they get worse. Do not take other medicines that contain aspirin, ibuprofen, or naproxen with this medicine. Side effects such as stomach upset, nausea, or ulcers may be more likely to occur. Many medicines available without a prescription should not be taken with this medicine. This medicine can cause ulcers and bleeding in the stomach and intestines at any time during treatment. This can happen with no warning and may cause death. There is increased risk with taking this medicine for a long time. Smoking, drinking alcohol, older age,   and poor health can also increase risks. Call your doctor right away if you have stomach pain or blood in your  vomit or stool. This medicine does not prevent heart attack or stroke. In fact, this medicine may increase the chance of a heart attack or stroke. The chance may increase with longer use of this medicine and in people who have heart disease. If you take aspirin to prevent heart attack or stroke, talk with your doctor or health care professional. What side effects may I notice from receiving this medicine? Side effects that you should report to your doctor or health care professional as soon as possible: -allergic reactions like skin rash, itching or hives, swelling of the face, lips, or tongue -nausea, vomiting -signs and symptoms of a blood clot such as breathing problems; changes in vision; chest pain; severe, sudden headache; pain, swelling, warmth in the leg; trouble speaking; sudden numbness or weakness of the face, arm, or leg -signs and symptoms of bleeding such as bloody or black, tarry stools; red or dark-brown urine; spitting up blood or brown material that looks like coffee grounds; red spots on the skin; unusual bruising or bleeding from the eye, gums, or nose -signs and symptoms of liver injury like dark yellow or brown urine; general ill feeling or flu-like symptoms; light-colored stools; loss of appetite; nausea; right upper belly pain; unusually weak or tired; yellowing of the eyes or skin -signs and symptoms of stroke like changes in vision; confusion; trouble speaking or understanding; severe headaches; sudden numbness or weakness of the face, arm, or leg; trouble walking; dizziness; loss of balance or coordination Side effects that usually do not require medical attention (report to your doctor or health care professional if they continue or are bothersome): -constipation -diarrhea -gas This list may not describe all possible side effects. Call your doctor for medical advice about side effects. You may report side effects to FDA at 1-800-FDA-1088. Where should I keep my  medicine? Keep out of the reach of children. Store at room temperature between 15 and 30 degrees C (59 and 86 degrees F). Throw away any unused medicine after the expiration date. NOTE: This sheet is a summary. It may not cover all possible information. If you have questions about this medicine, talk to your doctor, pharmacist, or health care provider.  2018 Elsevier/Gold Standard (2015-11-26 19:28:16)  

## 2018-04-06 NOTE — Progress Notes (Signed)
Gynecology Pelvic Pain Evaluation   Chief Complaint:  Chief Complaint  Patient presents with  . Pelvic Pain    left side   History of Present Illness:   Patient is a 36 y.o. [id number] who LMP was Patient's last menstrual period was 04/03/2017., presents today for a problem visit.  She complains of pain.   Her pain is localized to the LLQ and deep pelvis area, described as constant and stabbing, although also has some cyclical features to its occurrence, and it began several months ago and its severity is described as moderate. The pain radiates to the  Non-radiating. She has these associated symptoms which include none. Patient has these modifiers which include relaxation and pain medication that make it better and unable to associate with any factor that make it worse.  Context includes: spontaneous.  Pt had TLH last year for menorrhagia, no bleeding and feels she has healed well from surgery.  Previous evaluation: emergency room visit on 04/05/18. Prior Diagnosis: CT normal, no cysts.  No other etiology found.. Previous Treatment: Tramadol; SE dizziness.  PMHx: She  has a past medical history of Chronic tonsillitis. Also,  has a past surgical history that includes Tubal ligation; Laparoscopic hysterectomy (Bilateral, 04/12/2017); and Cystoscopy (04/12/2017)., family history includes Cancer in her mother; Diabetes in her mother; Dysmenorrhea in her sister; Hypertension in her mother.,  reports that she has never smoked. She has never used smokeless tobacco. She reports that she does not drink alcohol or use drugs.  She has a current medication list which includes the following prescription(s): tramadol, acetaminophen, acetaminophen-caff-pyrilamine, aspirin-salicylamide-caffeine, hydrocodone-acetaminophen, ibuprofen, meloxicam, and naproxen sodium. Also, is allergic to oxycodone-acetaminophen.  Review of Systems  Constitutional: Negative for chills, fever and malaise/fatigue.  HENT: Negative for  congestion, sinus pain and sore throat.   Eyes: Negative for blurred vision and pain.  Respiratory: Negative for cough and wheezing.   Cardiovascular: Negative for chest pain and leg swelling.  Gastrointestinal: Negative for abdominal pain, constipation, diarrhea, heartburn, nausea and vomiting.  Genitourinary: Negative for dysuria, frequency, hematuria and urgency.  Musculoskeletal: Negative for back pain, joint pain, myalgias and neck pain.  Skin: Negative for itching and rash.  Neurological: Negative for dizziness, tremors and weakness.  Endo/Heme/Allergies: Does not bruise/bleed easily.  Psychiatric/Behavioral: Negative for depression. The patient is not nervous/anxious and does not have insomnia.    Objective: BP 100/60   Ht 5\' 4"  (1.626 m)   Wt 135 lb (61.2 kg)   LMP 04/03/2017 Comment: neg preg test  BMI 23.17 kg/m  Physical Exam  Constitutional: She is oriented to person, place, and time. She appears well-developed and well-nourished. No distress.  Genitourinary: Rectum normal and vagina normal. Pelvic exam was performed with patient supine. There is no rash or lesion on the right labia. There is no rash or lesion on the left labia. Vagina exhibits no lesion. No bleeding in the vagina. Right adnexum does not display mass and does not display tenderness. Left adnexum does not display mass and does not display tenderness.  Genitourinary Comments: Absent Uterus Absent cervix Vaginal cuff well healed  HENT:  Head: Normocephalic and atraumatic. Head is without laceration.  Right Ear: Hearing normal.  Left Ear: Hearing normal.  Nose: No epistaxis.  No foreign bodies.  Mouth/Throat: Uvula is midline, oropharynx is clear and moist and mucous membranes are normal.  Eyes: Pupils are equal, round, and reactive to light.  Neck: Normal range of motion. Neck supple. No thyromegaly present.  Cardiovascular: Normal rate  and regular rhythm. Exam reveals no gallop and no friction rub.  No  murmur heard. Pulmonary/Chest: Effort normal and breath sounds normal. No respiratory distress. She has no wheezes. Right breast exhibits no mass, no skin change and no tenderness. Left breast exhibits no mass, no skin change and no tenderness.  Abdominal: Soft. Bowel sounds are normal. She exhibits no distension. There is no tenderness. There is no rebound.  Musculoskeletal: Normal range of motion.  Neurological: She is alert and oriented to person, place, and time. No cranial nerve deficit.  Skin: Skin is warm and dry.  Psychiatric: She has a normal mood and affect. Judgment normal.  Vitals reviewed.  Female chaperone present for pelvic portion of the physical exam  Assessment: 37 y.o. [id number] with Functional: LLQ pain, no sign of ovarian cyst, prior hysterectomy.  Visit Diagnoses    LLQ pain    -  Primary    Physical therapy advised Meloxicam for NSAID for pain [place] of ovary if persists (CT reassuring)  Barnett Applebaum, MD, [facility], [facility] 04/06/2018  10:51 AM

## 2018-04-21 ENCOUNTER — Ambulatory Visit: Payer: Medicaid Other | Attending: Obstetrics & Gynecology

## 2018-04-21 ENCOUNTER — Other Ambulatory Visit: Payer: Self-pay

## 2018-04-21 DIAGNOSIS — R293 Abnormal posture: Secondary | ICD-10-CM | POA: Diagnosis present

## 2018-04-21 DIAGNOSIS — M62838 Other muscle spasm: Secondary | ICD-10-CM | POA: Insufficient documentation

## 2018-04-21 DIAGNOSIS — M791 Myalgia, unspecified site: Secondary | ICD-10-CM | POA: Diagnosis present

## 2018-04-21 DIAGNOSIS — K5902 Outlet dysfunction constipation: Secondary | ICD-10-CM | POA: Insufficient documentation
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
## 2018-04-28 ENCOUNTER — Ambulatory Visit: Payer: Medicaid Other

## 2018-04-28 DIAGNOSIS — K5902 Outlet dysfunction constipation: Secondary | ICD-10-CM

## 2018-04-28 DIAGNOSIS — M791 Myalgia, unspecified site: Secondary | ICD-10-CM

## 2018-04-28 DIAGNOSIS — R293 Abnormal posture: Secondary | ICD-10-CM

## 2018-04-28 DIAGNOSIS — M62838 Other muscle spasm: Secondary | ICD-10-CM

## 2018-04-28 NOTE — Therapy (Signed)
[place] MAIN [facility] SERVICES [address] [place], [place], [address] Phone: [phone]   Fax:  [phone]  Physical Therapy Treatment  Patient Details  Name: Bridget Wong MRN: [id number] Date of Birth: 07-26-1981 No data recorded  Encounter Date: 04/28/2018    Past Medical History:  Diagnosis Date  . Chronic tonsillitis     Past Surgical History:  Procedure Laterality Date  . CYSTOSCOPY  04/12/2017   Procedure: CYSTOSCOPY;  Surgeon: Gae Dry, MD;  Location: [facility] ORS;  Service: Gynecology;;  . LAPAROSCOPIC HYSTERECTOMY Bilateral 04/12/2017   Procedure: HYSTERECTOMY TOTAL LAPAROSCOPIC WITH BILATERAL SALPINGECTOMY;  Surgeon: Gae Dry, MD;  Location: [facility] ORS;  Service: Gynecology;  Laterality: Bilateral;  . TUBAL LIGATION      There were no vitals filed for this visit.    Pelvic Floor Physical Therapy Treatment Note  SCREENING  Changes in medications, allergies, or medical history?: no    SUBJECTIVE  Patient reports: Has not had any pain for more days this past week. Had pain 4 days over the last week, has done her stretches 3 times since last week.   Pain update:  Location of pain: lower pelvis Current pain:  0/10  Max pain:  10/10 Least pain:  0/10 Nature of pain: Sharp  Patient Goals: Decrease pain, decrease constipation.     OBJECTIVE  Changes in: Posture/Observations:    Range of Motion/Flexibilty:    Strength/MMT:  LE MMT: strength increased to 4+ on L hip flexion and great-toe extension and 5/5 for other LLE myotomes.  Pelvic floor:  Abdominal:   Palpation:  Gait Analysis:  INTERVENTIONS THIS SESSION: Manual:Performed STM and TP release to L lumbar extensors, QL, iliacus, and B proximal adductors to decrease spasms putting pressure on nerves innervating the pelvis to decrease pain and spasm and prevent "giving out" of the L leg.  Dry Needle: NM re-ed: educated on pelvic tilt  and posture to decrease pressure on the lumbar nerve roots and help decrease tension of the PFM. Therex: Educated on and practiced hip flexor stretch and pelvic tilts to help maintain improved length of the muscles and keep back mobile.  Total time: 70 min.                           PT Short Term Goals - 04/21/18 1614      PT SHORT TERM GOAL #1   Title  Patient will demonstrate a coordinated contraction, relaxation, and bulge of the pelvic floor muscles to demonstrate functional recruitment and motion and allow for further strengthening.    Time  4    Period  Weeks    Status  New    Target Date  05/19/18      PT SHORT TERM GOAL #2   Title  Patient will demonstrate improved pelvic alignment and balance of musculature surrounding the pelvis to facilitate decreased PFM spasms and decrease pelvic pain.    Baseline  L up-slip and rotation    Time  4    Period  Weeks    Status  New    Target Date  05/19/18        PT Long Term Goals - 04/21/18 1615      PT LONG TERM GOAL #1   Title  Patient will score less than or equal to 20% on the Female NIH-CPSI to demonstrate a reduction in pain, urinary symptoms, and an improved quality of life.  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     Baseline  3+ to 4+/ 5 throughout LLE.     Time  12    Period  Weeks    Status  New    Target Date  07/14/18      PT LONG TERM GOAL #3   Title  Patient will report having BM's at least every-other day with consistency between [facility] stool scale 3-5 over the prior week to demonstrate decreased constipation.    Baseline  has ~ 2 BM's per week, bristol stool scale of 1, straining    Time  12     Period  Weeks    Status  New    Target Date  07/14/18      PT LONG TERM GOAL #4   Title  "Patient will describe pain no greater than 2/10 during work over past week to demonstrate improved functional ability.    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 Yeast vaginitis 06/20/2017   Willa Rough DPT, ATC Willa Rough 04/28/2018, 10:58 PM  [place] MAIN [facility] SERVICES [address] [place], [place], [address] Phone: [phone]   Fax:  [phone]  Name: Bridget Wong MRN: [id number] Date of Birth: [birth year]

## 2018-05-05 ENCOUNTER — Ambulatory Visit: Payer: Medicaid Other

## 2018-05-12 ENCOUNTER — Ambulatory Visit: Payer: BLUE CROSS/BLUE SHIELD | Attending: Obstetrics & Gynecology

## 2018-05-12 DIAGNOSIS — M791 Myalgia, unspecified site: Secondary | ICD-10-CM | POA: Diagnosis not present

## 2018-05-12 DIAGNOSIS — K5902 Outlet dysfunction constipation: Secondary | ICD-10-CM

## 2018-05-12 DIAGNOSIS — M62838 Other muscle spasm: Secondary | ICD-10-CM | POA: Insufficient documentation

## 2018-05-12 DIAGNOSIS — R293 Abnormal posture: Secondary | ICD-10-CM | POA: Diagnosis present

## 2018-05-12 NOTE — Therapy (Addendum)
[place] MAIN [facility] SERVICES [address] [place], [place], [address] Phone: [phone]   Fax:  [phone]  Physical Therapy Treatment  Patient Details  Name: Bridget Wong MRN: [id number] Date of Birth: Apr 24, 1981 No data recorded  Encounter Date: 05/12/2018  PT End of Session - 05/15/18 0830    Visit Number  3    Number of Visits  12    Date for PT Re-Evaluation  05/19/18    Authorization Type  medicaid    PT Start Time  1448    PT Stop Time  1110    PT Time Calculation (min)  55 min    Activity Tolerance  Patient tolerated treatment well    Behavior During Therapy  [facility] for tasks assessed/performed       Past Medical History:  Diagnosis Date  . Chronic tonsillitis     Past Surgical History:  Procedure Laterality Date  . CYSTOSCOPY  04/12/2017   Procedure: CYSTOSCOPY;  Surgeon: Gae Dry, MD;  Location: [facility] ORS;  Service: Gynecology;;  . LAPAROSCOPIC HYSTERECTOMY Bilateral 04/12/2017   Procedure: HYSTERECTOMY TOTAL LAPAROSCOPIC WITH BILATERAL SALPINGECTOMY;  Surgeon: Gae Dry, MD;  Location: [facility] ORS;  Service: Gynecology;  Laterality: Bilateral;  . TUBAL LIGATION      There were no vitals filed for this visit.   Pelvic Floor Physical Therapy Treatment Note  SCREENING  Changes in medications, allergies, or medical history?: no   SUBJECTIVE  Patient reports: Hs not had any pain over the last 2 weeks. Continues to be constipated.  Pain update: No pain.  Patient Goals: Decrease pain, decrease constipation.   OBJECTIVE  Changes in: Posture/Observations:  hyperlordotic  Pelvic floor: Pt.  Inconsistently generates a 4+/5 contraction but demonstrates poor coordination of the PFM with and without the TA and attempts to co-contract adductors.    INTERVENTIONS THIS SESSION: Self-care: Discussed diet and hydration to work on constipation.  NM Re-ed: educated on and practiced diaphragmatic breathing  and pelvic floor squeeze, relax, and lengthen for improved function and decreased constipation and straining. Manual: Performed PFM assessment and TP release to all muscles internally with resolution of tenderness to improve length and proprioception by decreasing spasms to decrease constipation and prevent return of pelvic pain.   Total time: 55 min.                           PT Education - 05/15/18 0828    Education Details  See Interventions this session    Person(s) Educated  Patient    Methods  Explanation;Demonstration;Verbal cues;Tactile cues    Comprehension  Verbalized understanding;Returned demonstration;Verbal cues required;Need further instruction;Tactile cues required       PT Short Term Goals - 05/12/18 1108      PT SHORT TERM GOAL #1   Title  Patient will demonstrate a coordinated contraction, relaxation, and bulge of the pelvic floor muscles to demonstrate functional recruitment and motion and allow for further strengthening.    Baseline  Patient has some difficulty with coordination still, is able to use TA contraction to improve recruitment with cueing    Time  4    Period  Weeks    Status  On-going    Target Date  05/26/18      PT SHORT TERM GOAL #2   Title  Patient will demonstrate improved pelvic alignment and balance of musculature surrounding the pelvis to facilitate decreased PFM spasms and decrease  pelvic pain.    Baseline  L up-slip and rotation    Time  4    Period  Weeks    Status  Achieved    Target Date  05/19/18        PT Long Term Goals - 05/12/18 1110      PT LONG TERM GOAL #1   Title  Patient will score less than or equal to 20% on the Female NIH-CPSI to demonstrate a reduction in pain, urinary symptoms, and an improved quality of life.    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     Baseline  3+ to 4+/ 5 throughout LLE.     Time  12    Period  Weeks    Status  Achieved    Target Date  07/14/18      PT LONG TERM GOAL #3   Title  Patient will report having BM's at least every-other day with consistency between [facility] stool scale 3-5 over the prior week to demonstrate decreased constipation.    Status  On-going    Target Date  07/14/18      PT LONG TERM GOAL #4   Title  "Patient will describe pain no greater than 2/10 during work over past week to demonstrate improved functional ability.    Status  Achieved    Target Date  07/14/18            Plan - 05/15/18 7564    Clinical Impression Statement  Pt. responded well to all interventions today, demonstrating improved coordination and recruitment of the PFM followig treatment. Her pain has not returned over past 2 weeks but she continues to demonstrate constipation likely due to poor coordination, spasm,, and poor diet/hydration. Continue per POC.    Clinical Presentation  Evolving    Clinical Decision Making  Moderate    Rehab Potential  Good    Clinical Impairments Affecting Rehab Potential  Persistent post-operative pain    PT Frequency  1x / week    PT Duration  12 weeks    PT Treatment/Interventions  ADLs/Self Care Home Management;Biofeedback;Electrical Stimulation;Traction;Moist Heat;Functional mobility training;Therapeutic activities;Therapeutic exercise;Patient/family education;Neuromuscular re-education;Manual techniques;Scar mobilization;Taping;Energy conservation;Dry needling;Other (comment)    PT Next Visit Plan  Educate on ILY, bowel retraining, review posture, re-assess for D/C vs cont. therapy to manage constipation.    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 Yeast vaginitis 06/20/2017   Willa Rough DPT, ATC Willa Rough 05/15/2018, 8:36 AM  [place] [facility]IN REH[facility]  SERVICES [place], [place], [address] Phone: [phone]   Fax:  [phone]  Name: Bridget Wong MRN: [id number] Date of Birth: 02-Jul-1981

## 2018-05-17 ENCOUNTER — Ambulatory Visit: Payer: BLUE CROSS/BLUE SHIELD

## 2018-09-13 ENCOUNTER — Ambulatory Visit (INDEPENDENT_AMBULATORY_CARE_PROVIDER_SITE_OTHER): Payer: Medicaid Other | Admitting: Obstetrics & Gynecology

## 2018-09-13 ENCOUNTER — Other Ambulatory Visit (HOSPITAL_COMMUNITY)
Admission: RE | Admit: 2018-09-13 | Discharge: 2018-09-13 | Disposition: A | Discharge status: Home / Self Care | Payer: Medicaid Other | Source: Ambulatory Visit | Attending: Obstetrics & Gynecology | Admitting: Obstetrics & Gynecology

## 2018-09-13 ENCOUNTER — Encounter: Payer: Self-pay | Admitting: Obstetrics & Gynecology

## 2018-09-13 VITALS — BP 120/80 | Ht 64.0 in | Wt 129.0 lb

## 2018-09-13 DIAGNOSIS — N898 Other specified noninflammatory disorders of vagina: Secondary | ICD-10-CM | POA: Insufficient documentation

## 2018-09-13 DIAGNOSIS — Z0000 Encounter for general adult medical examination without abnormal findings: Secondary | ICD-10-CM

## 2018-09-13 NOTE — Progress Notes (Signed)
HPI:      [name] is a 37 y.o. [phone] who LMP was Patient's last menstrual period was 04/03/2017., she is s/p TLH, she presents today for her annual examination. The patient has no complaints today other than 2 episodes of spotting/ discharge last time was 3 days ago, scant and without assoc sx; also occas mild  Non-radiating LLQ pain. The patient is sexually active. Her last pap: approximate date 2017 and was normal. The patient does perform self breast exams.  There is no notable family history of breast or ovarian cancer in her family.  The patient has regular exercise: yesThe patient denies current symptoms of depression.    GYN History: Contraception: status post hysterectomy  PMHx: Past Medical History:  Diagnosis Date  . Chronic tonsillitis    Past Surgical History:  Procedure Laterality Date  . CYSTOSCOPY  04/12/2017   Procedure: CYSTOSCOPY;  Surgeon: Gae Dry, MD;  Location: [facility] ORS;  Service: Gynecology;;  . LAPAROSCOPIC HYSTERECTOMY Bilateral 04/12/2017   Procedure: HYSTERECTOMY TOTAL LAPAROSCOPIC WITH BILATERAL SALPINGECTOMY;  Surgeon: Gae Dry, MD;  Location: [facility] ORS;  Service: Gynecology;  Laterality: Bilateral;  . TUBAL LIGATION     Family History  Problem Relation Age of Onset  . Diabetes Mother   . Hypertension Mother   . Cancer Mother   . Dysmenorrhea Sister    Social History   Tobacco Use  . Smoking status: Never Smoker  . Smokeless tobacco: Never Used  Substance Use Topics  . Alcohol use: No  . Drug use: No    Current Outpatient Medications:  .  acetaminophen (TYLENOL) 500 MG tablet, Take 1,000 mg by mouth every 3 (three) hours as needed (for menstration cramping/pain)., Disp: , Rfl:  .  Acetaminophen-Caff-Pyrilamine (MIDOL COMPLETE) 500-60-15 MG TABS, Take 2 tablets by mouth every 3 (three) hours as needed (for menstration cramping/pain)., Disp: , Rfl:  .  Aspirin-Salicylamide-Caffeine (BC FAST PAIN RELIEF) 650-195-33.3 MG  PACK, Take 1 packet by mouth 3 (three) times daily as needed (for menstration cramping/pain)., Disp: , Rfl:  .  ibuprofen (ADVIL,MOTRIN) 200 MG tablet, Take 400 mg by mouth every 3 (three) hours as needed (for menstration cramping/pain)., Disp: , Rfl:  .  naproxen sodium (ANAPROX) 220 MG tablet, Take 440 mg by mouth every 3 (three) hours as needed (for menstration cramping/pain)., Disp: , Rfl:  Allergies: Oxycodone-acetaminophen  Review of Systems  Constitutional: Negative for chills, fever and malaise/fatigue.  HENT: Negative for congestion, sinus pain and sore throat.   Eyes: Negative for blurred vision and pain.  Respiratory: Negative for cough and wheezing.   Cardiovascular: Negative for chest pain and leg swelling.  Gastrointestinal: Negative for abdominal pain, constipation, diarrhea, heartburn, nausea and vomiting.  Genitourinary: Negative for dysuria, frequency, hematuria and urgency.  Musculoskeletal: Negative for back pain, joint pain, myalgias and neck pain.  Skin: Negative for itching and rash.  Neurological: Negative for dizziness, tremors and weakness.  Endo/Heme/Allergies: Does not bruise/bleed easily.  Psychiatric/Behavioral: Negative for depression. The patient is not nervous/anxious and does not have insomnia.    Objective: BP 120/80   Ht 5\' 4"  (1.626 m)   Wt 129 lb (58.5 kg)   LMP 04/03/2017 Comment: neg preg test  BMI 22.14 kg/m   Filed Weights   09/13/18 0801  Weight: 129 lb (58.5 kg)   Body mass index is 22.14 kg/m. Physical Exam  Constitutional: She is oriented to person, place, and time. She appears well-developed and well-nourished. No distress.  Genitourinary: Rectum normal and vagina normal. Pelvic exam was performed with patient supine. There is no rash or lesion on the right labia. There is no rash or lesion on the left labia. Vagina exhibits no lesion. No bleeding in the vagina. Right adnexum does not display mass and does not display tenderness. Left  adnexum does not display mass and does not display tenderness.  Genitourinary Comments: Absent Uterus Absent cervix Vaginal cuff well healed, no area of bleeding, scant discharge, no blood No adnexal mass, min T on left  HENT:  Head: Normocephalic and atraumatic. Head is without laceration.  Right Ear: Hearing normal.  Left Ear: Hearing normal.  Nose: No epistaxis.  No foreign bodies.  Mouth/Throat: Uvula is midline, oropharynx is clear and moist and mucous membranes are normal.  Eyes: Pupils are equal, round, and reactive to light.  Neck: Normal range of motion. Neck supple. No thyromegaly present.  Cardiovascular: Normal rate and regular rhythm. Exam reveals no gallop and no friction rub.  No murmur heard. Pulmonary/Chest: Effort normal and breath sounds normal. No respiratory distress. She has no wheezes. Right breast exhibits no mass, no skin change and no tenderness. Left breast exhibits no mass, no skin change and no tenderness.  Abdominal: Soft. Bowel sounds are normal. She exhibits no distension. There is no tenderness. There is no rebound.  Musculoskeletal: Normal range of motion.  Neurological: She is alert and oriented to person, place, and time. No cranial nerve deficit.  Skin: Skin is warm and dry.  Psychiatric: She has a normal mood and affect. Judgment normal.  Vitals reviewed.  Microscopic wet-mount exam shows negative for pathogens, normal epithelial cells.  Assessment:  ANNUAL EXAM 1. Annual physical exam   2. Vaginal discharge    Screening Plan:            1.  Cervical Screening-  Pap smear schedule reviewed with patient  2.  Labs managed by PCP  3. NuSwab culture to assess vag spotting/ discharge  4. Monitor LLQ for possible cyst (none on exam today).  Adhesions, GI, MS etiology discussed as well.     F/U  Return in about 1 year (around 09/14/2019) for Annual.  Barnett Applebaum, MD, [facility], [facility] 09/13/2018  8:12 AM

## 2018-09-13 NOTE — Patient Instructions (Addendum)
PAP every 5 years Labs yearly (with PCP)

## 2018-09-14 ENCOUNTER — Other Ambulatory Visit: Payer: Self-pay | Admitting: Obstetrics & Gynecology

## 2018-09-14 LAB — CERVICOVAGINAL ANCILLARY ONLY
Bacterial vaginitis: POSITIVE — AB
CANDIDA VAGINITIS: NEGATIVE
CHLAMYDIA, DNA PROBE: NEGATIVE
NEISSERIA GONORRHEA: NEGATIVE
Trichomonas: NEGATIVE

## 2018-09-14 MED ORDER — METRONIDAZOLE 0.75 % VA GEL: 1.0000 | Freq: Every day | VAGINAL | 0 refills | Status: DC

## 2018-09-14 NOTE — Progress Notes (Signed)
Let her know she has BV and I have sent Rx to pharmacy

## 2018-09-15 NOTE — Progress Notes (Signed)
Pt wants you to change to pill form

## 2018-09-17 ENCOUNTER — Other Ambulatory Visit: Payer: Self-pay | Admitting: Obstetrics & Gynecology

## 2018-09-17 MED ORDER — METRONIDAZOLE 500 MG PO TABS: 500.0000 mg | ORAL_TABLET | Freq: Two times a day (BID) | ORAL | 0 refills | Status: AC

## 2018-09-17 NOTE — Progress Notes (Signed)
ERx done (Sunday), let her know if she still needs it (if she hasnt taken other)

## 2018-09-18 NOTE — Progress Notes (Signed)
Pt aware.

## 2019-08-19 ENCOUNTER — Emergency Department: Payer: Medicaid Other

## 2019-08-19 ENCOUNTER — Emergency Department
Admission: EM | Admit: 2019-08-19 | Discharge: 2019-08-19 | Disposition: A | Discharge status: Home / Self Care | Payer: Medicaid Other | Attending: Emergency Medicine | Admitting: Emergency Medicine

## 2019-08-19 ENCOUNTER — Other Ambulatory Visit: Payer: Self-pay

## 2019-08-19 ENCOUNTER — Encounter: Payer: Self-pay | Admitting: Emergency Medicine

## 2019-08-19 DIAGNOSIS — Y929 Unspecified place or not applicable: Secondary | ICD-10-CM | POA: Insufficient documentation

## 2019-08-19 DIAGNOSIS — Y999 Unspecified external cause status: Secondary | ICD-10-CM | POA: Insufficient documentation

## 2019-08-19 DIAGNOSIS — Y939 Activity, unspecified: Secondary | ICD-10-CM | POA: Insufficient documentation

## 2019-08-19 DIAGNOSIS — Z7982 Long term (current) use of aspirin: Secondary | ICD-10-CM | POA: Diagnosis not present

## 2019-08-19 DIAGNOSIS — S8002XA Contusion of left knee, initial encounter: Secondary | ICD-10-CM | POA: Diagnosis not present

## 2019-08-19 DIAGNOSIS — Z79899 Other long term (current) drug therapy: Secondary | ICD-10-CM | POA: Insufficient documentation

## 2019-08-19 DIAGNOSIS — S8992XA Unspecified injury of left lower leg, initial encounter: Secondary | ICD-10-CM | POA: Diagnosis present

## 2019-08-19 DIAGNOSIS — W19XXXA Unspecified fall, initial encounter: Secondary | ICD-10-CM | POA: Insufficient documentation

## 2019-08-19 NOTE — Discharge Instructions (Addendum)
Follow-up with your primary care provider if any continued problems.  Use ice to your knee as needed for pain or swelling.  You may take 2 Aleve twice a day with food which is the equivalent to the prescription naproxen.

## 2019-08-19 NOTE — ED Triage Notes (Signed)
C/O left knee pain x 1 week.  States fell onto concrete last Saturday.

## 2019-08-19 NOTE — ED Provider Notes (Signed)
[facility] Emergency Department Provider Note  ____________________________________________   First MD Initiated Contact with Patient 08/19/19 1120     (approximate)  I have reviewed the triage vital signs and the nursing notes.   HISTORY  Chief Complaint Knee Injury   HPI ZAKYRIA WEHRHEIM is a 38 y.o. female presents to the ED with complaint of left knee pain for 1 week.  Patient states that she fell on concrete and landed on both her knees however the left knee has continued to be painful.  She denies any head injury or loss of consciousness during her fall.  She took ibuprofen last evening with minimal relief.  She has not taken any medication today.  She rates her pain as a 10/10.      Past Medical History:  Diagnosis Date  . Chronic tonsillitis     Patient Active Problem List   Diagnosis Date Noted  . Yeast vaginitis 06/20/2017    Past Surgical History:  Procedure Laterality Date  . CYSTOSCOPY  04/12/2017   Procedure: CYSTOSCOPY;  Surgeon: Gae Dry, MD;  Location: [facility] ORS;  Service: Gynecology;;  . LAPAROSCOPIC HYSTERECTOMY Bilateral 04/12/2017   Procedure: HYSTERECTOMY TOTAL LAPAROSCOPIC WITH BILATERAL SALPINGECTOMY;  Surgeon: Gae Dry, MD;  Location: [facility] ORS;  Service: Gynecology;  Laterality: Bilateral;  . TUBAL LIGATION      Prior to Admission medications   Medication Sig Start Date End Date Taking? Authorizing Provider  acetaminophen (TYLENOL) 500 MG tablet Take 1,000 mg by mouth every 3 (three) hours as needed (for menstration cramping/pain).    [provider]  Acetaminophen-Caff-Pyrilamine (MIDOL COMPLETE) 500-60-15 MG TABS Take 2 tablets by mouth every 3 (three) hours as needed (for menstration cramping/pain).    [provider]  Aspirin-Salicylamide-Caffeine (BC FAST PAIN RELIEF) 650-195-33.3 MG PACK Take 1 packet by mouth 3 (three) times daily as needed (for menstration cramping/pain).    [provider]  ibuprofen (ADVIL,MOTRIN) 200 MG tablet Take 400 mg by mouth every 3 (three) hours as needed (for menstration cramping/pain).    [provider]  metroNIDAZOLE (FLAGYL) 500 MG tablet Take 1 tablet (500 mg total) by mouth 2 (two) times daily. 09/17/18   Gae Dry, MD  naproxen sodium (ANAPROX) 220 MG tablet Take 440 mg by mouth every 3 (three) hours as needed (for menstration cramping/pain).    [provider]    Allergies Oxycodone-acetaminophen  Family History  Problem Relation Age of Onset  . Diabetes Mother   . Hypertension Mother   . Cancer Mother   . Dysmenorrhea Sister     Social History Social History   Tobacco Use  . Smoking status: Never Smoker  . Smokeless tobacco: Never Used  Substance Use Topics  . Alcohol use: No  . Drug use: No    Review of Systems Constitutional: No fever/chills Cardiovascular: Denies chest pain. Respiratory: Denies shortness of breath. Gastrointestinal:   No nausea, no vomiting. Musculoskeletal: Positive left knee pain. Skin: Negative for rash. Neurological: Negative for headaches, focal weakness or numbness. ___________________________________________   PHYSICAL EXAM:  VITAL SIGNS: ED Triage Vitals  Enc Vitals Group     BP 08/19/19 1105 100/64     Pulse Rate 08/19/19 1105 79     Resp 08/19/19 1105 15     Temp 08/19/19 1105 98.6 F (37 C)     Temp Source 08/19/19 1105 Oral     SpO2 08/19/19 1105 99 %     Weight 08/19/19  1103 128 lb 15.5 oz (58.5 kg)     Height 08/19/19 1103 5\' 4"  (1.626 m)     Head Circumference --      Peak Flow --      Pain Score 08/19/19 1103 10     Pain Loc --      Pain Edu? --      Excl. in [place]? --    Constitutional: Alert and oriented. Well appearing and in no acute distress. Eyes: Conjunctivae are normal.  Head: Atraumatic. Neck: No stridor.   Cardiovascular: Normal rate, regular rhythm. Grossly normal heart sounds.  Good peripheral circulation. Respiratory:  Normal respiratory effort.  No retractions. Lungs CTAB. Gastrointestinal: Soft and nontender. No distention.  Musculoskeletal: On examination of left knee there is no gross deformity however there is moderate tenderness on palpation of the anterior knee and soft tissue surrounding the patella.  No obvious effusion was noted. Neurologic:  Normal speech and language. No gross focal neurologic deficits are appreciated.  Skin:  Skin is warm, dry and intact.  No abrasions or ecchymosis noted. Psychiatric: Mood and affect are normal. Speech and behavior are normal.  ____________________________________________   LABS (all labs ordered are listed, but only abnormal results are displayed)  Labs Reviewed - No data to display  RADIOLOGY  Official radiology report(s): Dg Knee Complete 4 Views Left  Result Date: 08/19/2019 CLINICAL DATA:  Acute left knee pain after fall last week. EXAM: LEFT KNEE - COMPLETE 4+ VIEW COMPARISON:  None. FINDINGS: No evidence of fracture, dislocation, or joint effusion. No evidence of arthropathy or other focal bone abnormality. Soft tissues are unremarkable. IMPRESSION: Negative. Electronically Signed   By: Marijo Conception M.D.   On: 08/19/2019 12:32    ____________________________________________   PROCEDURES  Procedure(s) performed (including Critical Care):  Procedures   ____________________________________________   INITIAL IMPRESSION / ASSESSMENT AND PLAN / ED COURSE  As part of my medical decision making, I reviewed the following data within the electronic MEDICAL RECORD NUMBER Notes from prior ED visits and [place] Controlled Substance Database  39 year old female presents to the ED with complaint of left knee pain.  Patient states that she fell on concrete 1 week ago and is continued to have pain to her left knee.  She is continued to ambulate without any assistance.  Physical exam is unremarkable.  X-rays negative for acute bony changes.  Patient was made  aware.  She is encouraged to ice her knee as needed for pain or swelling.  Aleve 2 tablets twice daily with food.  ____________________________________________   FINAL CLINICAL IMPRESSION(S) / ED DIAGNOSES  Final diagnoses:  Contusion of left knee, initial encounter     ED Discharge Orders    None       Note:  This document was prepared using Dragon voice recognition software and may include unintentional dictation errors.    Johnn Hai, PA-C 08/19/19 1254    Nance Pear, MD 08/19/19 [phone]

## 2020-07-04 IMAGING — DX DG KNEE COMPLETE 4+V*L*
4 series · 4 of 4 positions shown · non-contrast
Comparison: None.

CLINICAL DATA: Acute left knee pain after fall last week.

EXAM:
LEFT KNEE - COMPLETE 4+ VIEW

[knee ap]
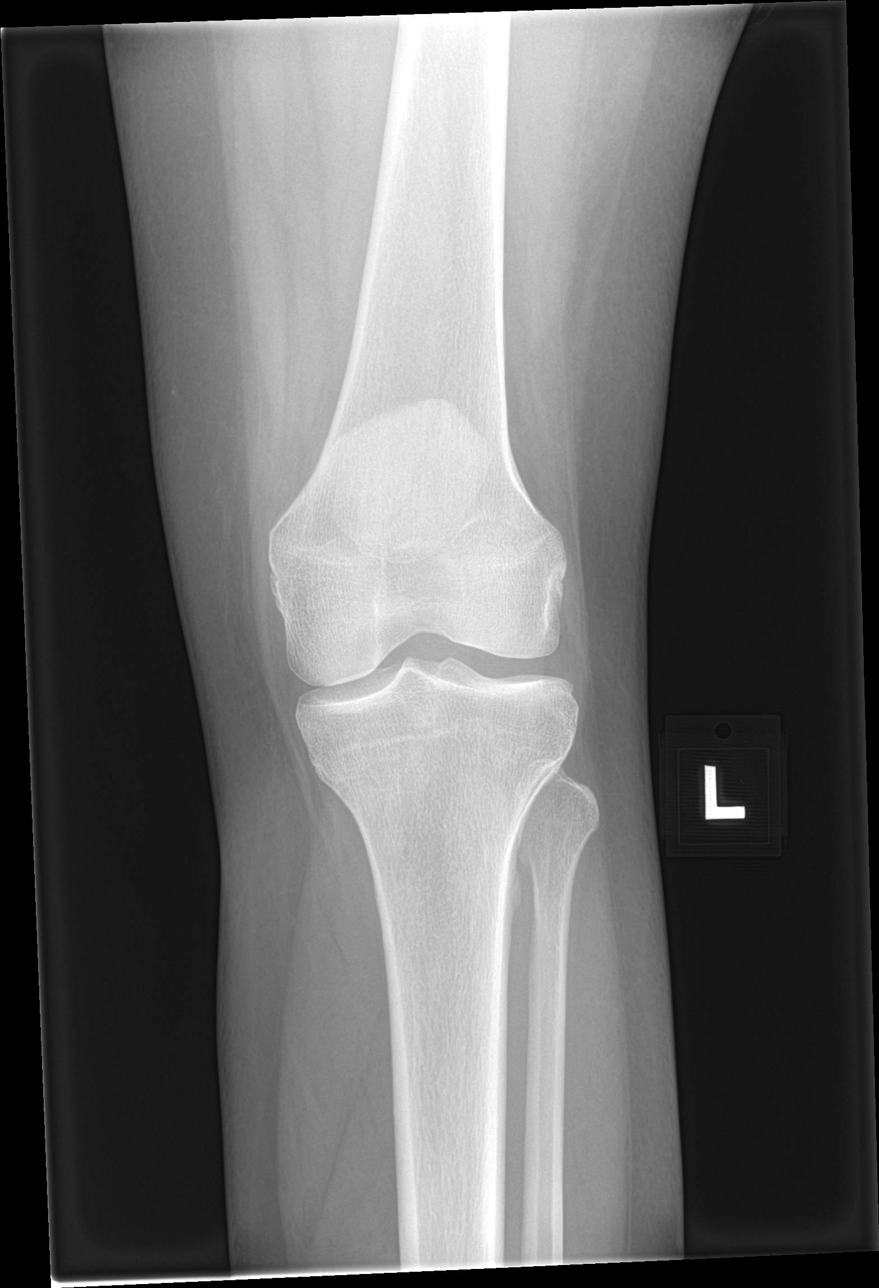

[knee lat]
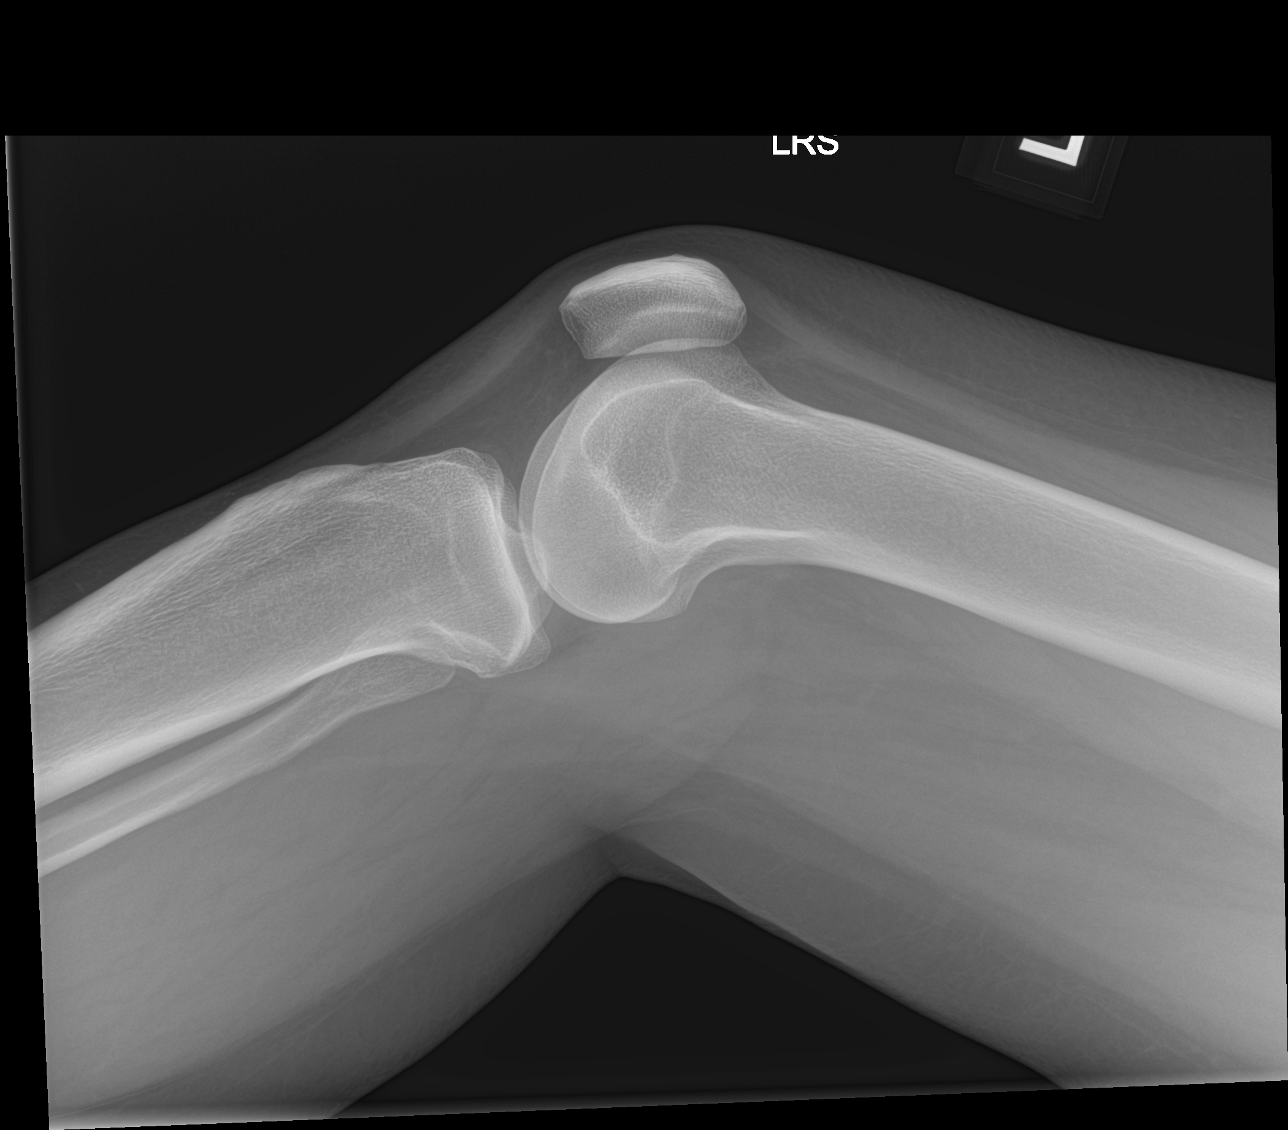

[knee obl (1 of 2)]
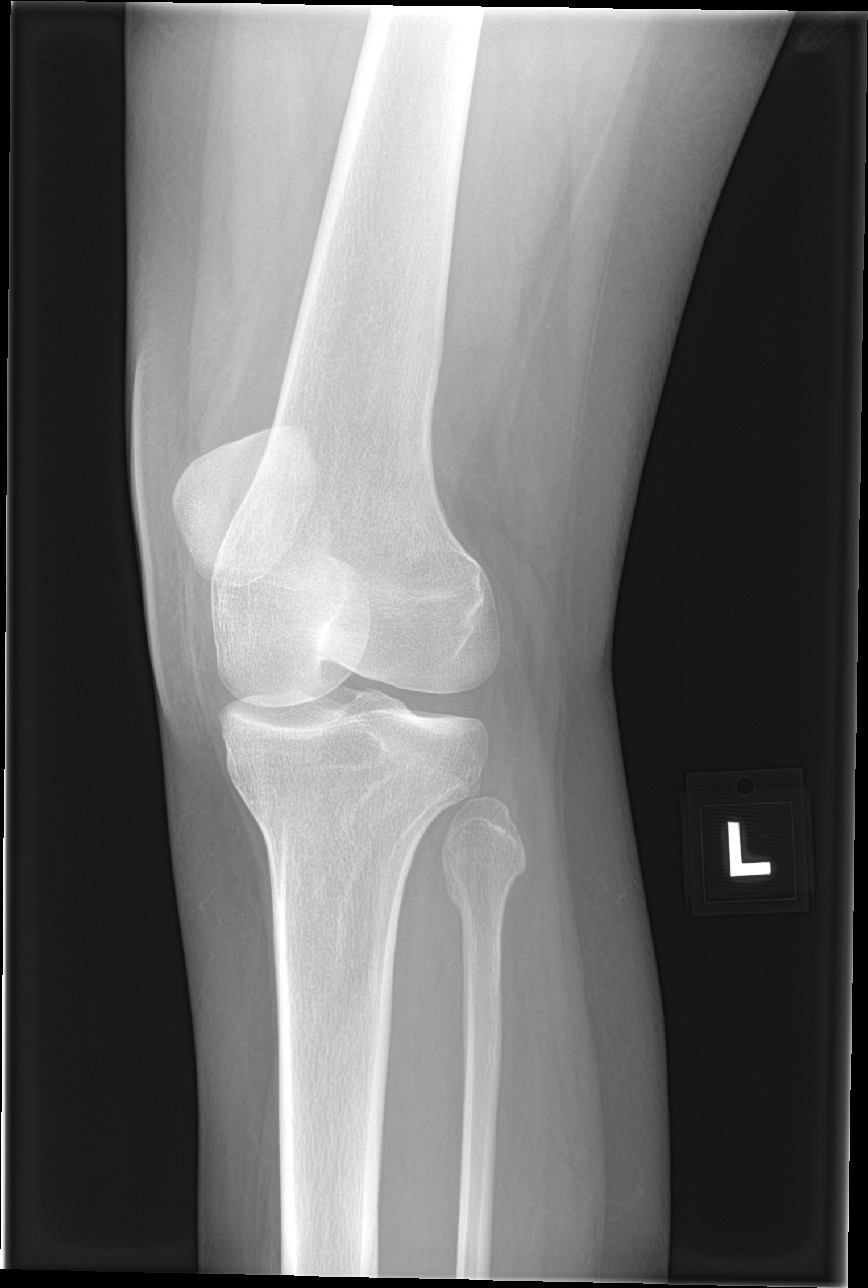

[knee obl (2 of 2)]
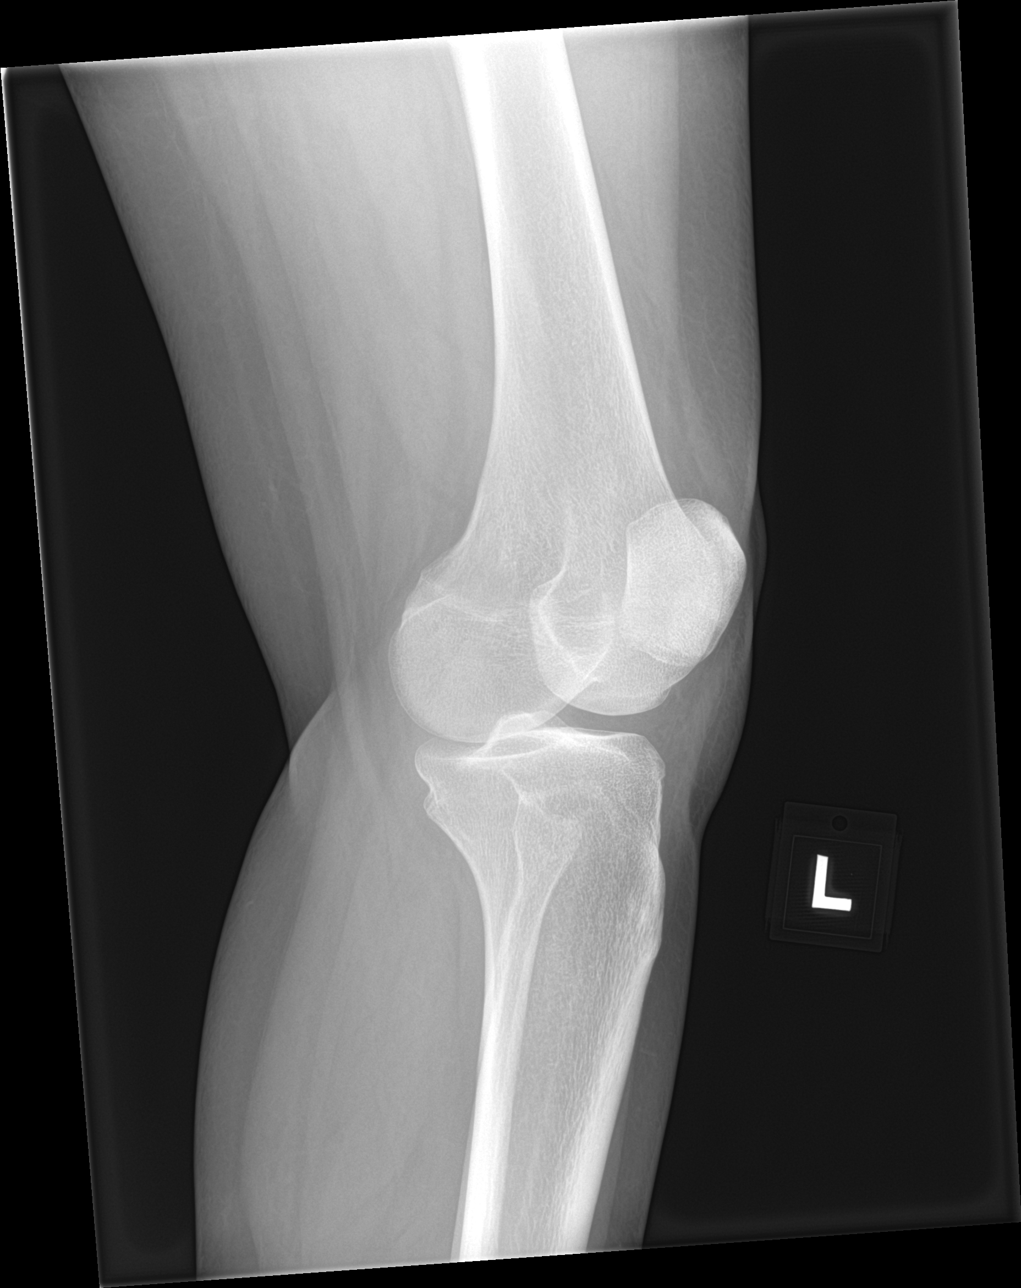

[4 of 4 positions shown; findings below may reference images not displayed]

FINDINGS: No evidence of fracture, dislocation, or joint effusion. No evidence
of arthropathy or other focal bone abnormality. Soft tissues are
unremarkable.
IMPRESSION: Negative.

## 2020-07-08 ENCOUNTER — Encounter: Payer: Self-pay | Admitting: Emergency Medicine

## 2020-07-08 ENCOUNTER — Other Ambulatory Visit: Payer: Self-pay

## 2020-07-08 ENCOUNTER — Emergency Department
Admission: EM | Admit: 2020-07-08 | Discharge: 2020-07-08 | Disposition: A | Discharge status: Home / Self Care | Payer: Medicaid Other | Attending: Emergency Medicine | Admitting: Emergency Medicine

## 2020-07-08 DIAGNOSIS — U071 COVID-19: Secondary | ICD-10-CM | POA: Diagnosis not present

## 2020-07-08 DIAGNOSIS — R0981 Nasal congestion: Secondary | ICD-10-CM | POA: Diagnosis present

## 2020-07-08 DIAGNOSIS — Z79899 Other long term (current) drug therapy: Secondary | ICD-10-CM | POA: Insufficient documentation

## 2020-07-08 DIAGNOSIS — B349 Viral infection, unspecified: Secondary | ICD-10-CM

## 2020-07-08 LAB — SARS CORONAVIRUS 2 BY RT PCR (HOSPITAL ORDER, PERFORMED IN ~~LOC~~ HOSPITAL LAB): SARS Coronavirus 2: POSITIVE — AB

## 2020-07-08 LAB — GROUP A STREP BY PCR: Group A Strep by PCR: NOT DETECTED

## 2020-07-08 MED ORDER — PSEUDOEPH-BROMPHEN-DM 30-2-10 MG/5ML PO SYRP: 10.0000 mL | ORAL_SOLUTION | Freq: Four times a day (QID) | ORAL | 0 refills | Status: AC | PRN

## 2020-07-08 MED ORDER — PREDNISONE 50 MG PO TABS: 50.0000 mg | ORAL_TABLET | Freq: Every day | ORAL | 0 refills | Status: AC

## 2020-07-08 MED ORDER — PREDNISONE 20 MG PO TABS
60.0000 mg | ORAL_TABLET | Freq: Once | ORAL | Status: AC
  Administered 2020-07-08: 60 mg via ORAL
  Filled 2020-07-08: qty 3

## 2020-07-08 NOTE — ED Triage Notes (Signed)
Pt to ED from home c/o nasal congestion and sore throat today, states 2 kids at home tested positive for COVID last couple days.  States did receive COVID vaccine.  Pt chest rise even and unlabored and in NAD at this time.

## 2020-07-08 NOTE — ED Provider Notes (Signed)
[facility] Emergency Department Provider Note  ____________________________________________  Time seen: Approximately 9:45 PM  I have reviewed the triage vital signs and the nursing notes.   HISTORY  Chief Complaint Nasal Congestion and Sore Throat    HPI Bridget Wong is a 39 y.o. female who presents the emergency department complaining of sore throat, malaise. Patient presents with her daughter who has several other symptoms consistent with COVID-19. Patient reports that 2 of her other children have already been diagnosed with COVID-19. Patient is vaccinated. She denies any headache, neck pain or stiffness, chest pain, shortness of breath abdominal pain, nausea vomiting, diarrhea or constipation. No medications prior to arrival.         Past Medical History:  Diagnosis Date  . Chronic tonsillitis     Patient Active Problem List   Diagnosis Date Noted  . Yeast vaginitis 06/20/2017    Past Surgical History:  Procedure Laterality Date  . CYSTOSCOPY  04/12/2017   Procedure: CYSTOSCOPY;  Surgeon: Gae Dry, MD;  Location: [facility] ORS;  Service: Gynecology;;  . LAPAROSCOPIC HYSTERECTOMY Bilateral 04/12/2017   Procedure: HYSTERECTOMY TOTAL LAPAROSCOPIC WITH BILATERAL SALPINGECTOMY;  Surgeon: Gae Dry, MD;  Location: [facility] ORS;  Service: Gynecology;  Laterality: Bilateral;  . TUBAL LIGATION      Prior to Admission medications   Medication Sig Start Date End Date Taking? Authorizing Provider  acetaminophen (TYLENOL) 500 MG tablet Take 1,000 mg by mouth every 3 (three) hours as needed (for menstration cramping/pain).    [provider]  Acetaminophen-Caff-Pyrilamine (MIDOL COMPLETE) 500-60-15 MG TABS Take 2 tablets by mouth every 3 (three) hours as needed (for menstration cramping/pain).    [provider]  Aspirin-Salicylamide-Caffeine (BC FAST PAIN RELIEF) 650-195-33.3 MG PACK Take 1 packet by mouth 3 (three) times daily as  needed (for menstration cramping/pain).    [provider]  brompheniramine-pseudoephedrine-DM 30-2-10 MG/5ML syrup Take 10 mLs by mouth 4 (four) times daily as needed. 07/08/20   Abrina Petz, Charline Bills, PA-C  ibuprofen (ADVIL,MOTRIN) 200 MG tablet Take 400 mg by mouth every 3 (three) hours as needed (for menstration cramping/pain).    [provider]  metroNIDAZOLE (FLAGYL) 500 MG tablet Take 1 tablet (500 mg total) by mouth 2 (two) times daily. 09/17/18   Gae Dry, MD  naproxen sodium (ANAPROX) 220 MG tablet Take 440 mg by mouth every 3 (three) hours as needed (for menstration cramping/pain).    [provider]  predniSONE (DELTASONE) 50 MG tablet Take 1 tablet (50 mg total) by mouth daily with breakfast. 07/08/20   Divante Kotch, Charline Bills, PA-C    Allergies Oxycodone-acetaminophen  Family History  Problem Relation Age of Onset  . Diabetes Mother   . Hypertension Mother   . Cancer Mother   . Dysmenorrhea Sister     Social History Social History   Tobacco Use  . Smoking status: Never Smoker  . Smokeless tobacco: Never Used  Vaping Use  . Vaping Use: Never used  Substance Use Topics  . Alcohol use: No  . Drug use: No     Review of Systems  Constitutional: No fever/chills Eyes: No visual changes. No discharge ENT: Positive for sore throat Cardiovascular: no chest pain. Respiratory: no cough. No SOB. Gastrointestinal: No abdominal pain.  No nausea, no vomiting.  No diarrhea.  No constipation. Musculoskeletal: Negative for musculoskeletal pain. Skin: Negative for rash, abrasions, lacerations, ecchymosis. Neurological: Negative for headaches, focal weakness or numbness. 10-point ROS otherwise negative.  ____________________________________________  PHYSICAL EXAM:  VITAL SIGNS: ED Triage Vitals  Enc Vitals Group     BP 07/08/20 1930 94/69     Pulse Rate 07/08/20 1930 75     Resp 07/08/20 1930 18     Temp 07/08/20 1930 99.1 F (37.3 C)      Temp Source 07/08/20 1930 Oral     SpO2 07/08/20 1930 98 %     Weight 07/08/20 1931 130 lb (59 kg)     Height 07/08/20 1931 5\' 5"  (1.651 m)     Head Circumference --      Peak Flow --      Pain Score 07/08/20 1931 6     Pain Loc --      Pain Edu? --      Excl. in [place]? --      Constitutional: Alert and oriented. Well appearing and in no acute distress. Eyes: Conjunctivae are normal. PERRL. EOMI. Head: Atraumatic. ENT:      Ears: EACs and TMs unremarkable bilaterally.      Nose: No congestion/rhinnorhea.      Mouth/Throat: Mucous membranes are moist. Oropharynx is minimally erythematous but not edematous. No exudates. Uvula is midline. Neck: No stridor.  No cervical spine tenderness to palpation. Hematological/Lymphatic/Immunilogical: No cervical lymphadenopathy. Cardiovascular: Normal rate, regular rhythm. Normal S1 and S2.  Good peripheral circulation. Respiratory: Normal respiratory effort without tachypnea or retractions. Lungs CTAB. Good air entry to the bases with no decreased or absent breath sounds. Musculoskeletal: Full range of motion to all extremities. No gross deformities appreciated. Neurologic:  Normal speech and language. No gross focal neurologic deficits are appreciated.  Skin:  Skin is warm, dry and intact. No rash noted. Psychiatric: Mood and affect are normal. Speech and behavior are normal. Patient exhibits appropriate insight and judgement.   ____________________________________________   LABS (all labs ordered are listed, but only abnormal results are displayed)  Labs Reviewed  SARS CORONAVIRUS 2 BY RT PCR (HOSPITAL ORDER, [place] [facility])  GROUP A STREP BY PCR   ____________________________________________  EKG   ____________________________________________  RADIOLOGY   No results found.  ____________________________________________    PROCEDURES  Procedure(s) performed:    Procedures    Medications   predniSONE (DELTASONE) tablet 60 mg (60 mg Oral Given 07/08/20 2215)     ____________________________________________   INITIAL IMPRESSION / ASSESSMENT AND PLAN / ED COURSE  Pertinent labs & imaging results that were available during my care of the patient were reviewed by me and considered in my medical decision making (see chart for details).  Review of the [place] CSRS was performed in accordance of the [place] prior to dispensing any controlled drugs.           Patient's diagnosis is consistent with viral illness, likely COVID-19. Patient has had close exposure to other family members who have tested positive for COVID-19. Patient presents with her daughter who has similar symptoms of COVID-19. Patient is only endorsing sore throat at this time. Patient is tested for Covid and strep but results have not returned at this time. I have a strong suspicion for COVID-19 with very little suspicion for strep. At this time patient be treated with prednisone and Bromfed cough syrup. Follow-up with primary care as needed. Information regarding the Covid antibody/remdesivir infusion clinic is given to the patient.. Return precautions discussed with the patient. Patient is given ED precautions to return to the ED for any worsening or new symptoms.     ____________________________________________  FINAL  CLINICAL IMPRESSION(S) / ED DIAGNOSES  Final diagnoses:  Viral illness      NEW MEDICATIONS STARTED DURING THIS VISIT:  ED Discharge Orders         Ordered    predniSONE (DELTASONE) 50 MG tablet  Daily with breakfast        07/08/20 2213    brompheniramine-pseudoephedrine-DM 30-2-10 MG/5ML syrup  4 times daily PRN        07/08/20 2213              This chart was dictated using voice recognition software/Dragon. Despite best efforts to proofread, errors can occur which can change the meaning. Any change was purely unintentional.    Darletta Moll, PA-C 07/08/20 2220     Arta Silence, MD 07/08/20 2312

## 2020-07-08 NOTE — Discharge Instructions (Addendum)
If you receive a positive Covid test, and this time that she went the antibody or antiviral infusion, called the Covid clinic.

## 2020-07-09 ENCOUNTER — Telehealth: Payer: Self-pay | Admitting: Emergency Medicine

## 2020-07-09 NOTE — Telephone Encounter (Signed)
Called to assure she is aware of covid result positive.  She is aware. And is aware of daughter zeni positive as well.

## 2020-07-09 NOTE — ED Notes (Signed)
Pt contacted about positive covid results for herself and her son.

## 2021-03-15 ENCOUNTER — Other Ambulatory Visit: Payer: Self-pay

## 2021-03-15 DIAGNOSIS — K648 Other hemorrhoids: Secondary | ICD-10-CM | POA: Insufficient documentation

## 2021-03-15 DIAGNOSIS — K6289 Other specified diseases of anus and rectum: Secondary | ICD-10-CM | POA: Diagnosis present

## 2021-03-15 NOTE — ED Triage Notes (Signed)
Pt states "I think I have a hemorrhoid." Hx of the same. Denies rectal bleeding, states "it hurts when I walk and when I sit." Reports recent straining for BMs, last BM today. Denies abd pain. Pain started tonight

## 2021-03-16 ENCOUNTER — Emergency Department
Admission: EM | Admit: 2021-03-16 | Discharge: 2021-03-16 | Disposition: A | Discharge status: Home / Self Care | Payer: Medicaid Other | Attending: Emergency Medicine | Admitting: Emergency Medicine

## 2021-03-16 DIAGNOSIS — K649 Unspecified hemorrhoids: Secondary | ICD-10-CM

## 2021-03-16 MED ORDER — HYDROCORT-PRAMOXINE (PERIANAL) 1-1 % EX FOAM: 1.0000 | Freq: Two times a day (BID) | CUTANEOUS | 1 refills | Status: AC

## 2021-03-16 NOTE — ED Provider Notes (Signed)
[facility] Emergency Department Provider Note  ____________________________________________   Event Date/Time   First MD Initiated Contact with Patient 03/16/21 [phone]     (approximate)  I have reviewed the triage vital signs and the nursing notes.   HISTORY  Chief Complaint Rectal Pain    HPI NAIROBI GUSTAFSON is a 40 y.o. female who presents for evaluation of rectal pain.  She says she thinks she has a hemorrhoid.  She has had 1 in the past that got drained in her emergency department in [place] but that was 5 or 6 years ago.  She said that she has been a little bit constipated recently and when she bears down she has some pain in her rectum.  Sometimes she sees some blood on the toilet paper.  She can feel something when she touches around her anus and thinks it is a hemorrhoid.  No recent fever.  No penetrative anal intercourse.  No nausea, vomiting, nor abdominal pain.  Nothing particular makes it better or worse.         Past Medical History:  Diagnosis Date  . Chronic tonsillitis     Patient Active Problem List   Diagnosis Date Noted  . Yeast vaginitis 06/20/2017    Past Surgical History:  Procedure Laterality Date  . CYSTOSCOPY  04/12/2017   Procedure: CYSTOSCOPY;  Surgeon: Gae Dry, MD;  Location: [facility] ORS;  Service: Gynecology;;  . LAPAROSCOPIC HYSTERECTOMY Bilateral 04/12/2017   Procedure: HYSTERECTOMY TOTAL LAPAROSCOPIC WITH BILATERAL SALPINGECTOMY;  Surgeon: Gae Dry, MD;  Location: [facility] ORS;  Service: Gynecology;  Laterality: Bilateral;  . TUBAL LIGATION      Prior to Admission medications   Medication Sig Start Date End Date Taking? Authorizing Provider  hydrocortisone-pramoxine [facility]) rectal foam Place 1 applicator rectally 2 (two) times daily. 03/16/21  Yes Hinda Kehr, MD  acetaminophen (TYLENOL) 500 MG tablet Take 1,000 mg by mouth every 3 (three) hours as needed (for menstration cramping/pain).     [provider]  Acetaminophen-Caff-Pyrilamine (MIDOL COMPLETE) 500-60-15 MG TABS Take 2 tablets by mouth every 3 (three) hours as needed (for menstration cramping/pain).    [provider]  Aspirin-Salicylamide-Caffeine (BC FAST PAIN RELIEF) 650-195-33.3 MG PACK Take 1 packet by mouth 3 (three) times daily as needed (for menstration cramping/pain).    [provider]  brompheniramine-pseudoephedrine-DM 30-2-10 MG/5ML syrup Take 10 mLs by mouth 4 (four) times daily as needed. 07/08/20   Cuthriell, Charline Bills, PA-C  ibuprofen (ADVIL,MOTRIN) 200 MG tablet Take 400 mg by mouth every 3 (three) hours as needed (for menstration cramping/pain).    [provider]  metroNIDAZOLE (FLAGYL) 500 MG tablet Take 1 tablet (500 mg total) by mouth 2 (two) times daily. 09/17/18   Gae Dry, MD  naproxen sodium (ANAPROX) 220 MG tablet Take 440 mg by mouth every 3 (three) hours as needed (for menstration cramping/pain).    [provider]  predniSONE (DELTASONE) 50 MG tablet Take 1 tablet (50 mg total) by mouth daily with breakfast. 07/08/20   Cuthriell, Charline Bills, PA-C    Allergies Oxycodone-acetaminophen  Family History  Problem Relation Age of Onset  . Diabetes Mother   . Hypertension Mother   . Cancer Mother   . Dysmenorrhea Sister     Social History Social History   Tobacco Use  . Smoking status: Never Smoker  . Smokeless tobacco: Never Used  Vaping Use  . Vaping Use: Never used  Substance Use Topics  .  Alcohol use: No  . Drug use: No    Review of Systems Constitutional: No fever/chills Cardiovascular: Denies chest pain. Respiratory: Denies shortness of breath. Gastrointestinal: Positive for rectal pain.  Occasional blood on the toilet paper.  No abdominal pain.  No nausea, no vomiting.  No diarrhea.  Positive for mild constipation. Integumentary: Negative for rash. Neurological: Negative for headaches, focal weakness or  numbness.   ____________________________________________   PHYSICAL EXAM:  VITAL SIGNS: ED Triage Vitals [03/15/21 2329]  Enc Vitals Group     BP 101/73     Pulse Rate (!) 56     Resp 16     Temp 98.4 F (36.9 C)     Temp Source Oral     SpO2 100 %     Weight 60.8 kg (134 lb)     Height 1.651 m (5\' 5" )     Head Circumference      Peak Flow      Pain Score 6     Pain Loc      Pain Edu?      Excl. in [place]?     Constitutional: Alert and oriented.  Eyes: Conjunctivae are normal.  Head: Atraumatic. Nose: No congestion/rhinnorhea. Mouth/Throat: Patient is wearing a mask. Neck: No stridor.  No meningeal signs.   Cardiovascular: Normal rate, regular rhythm. Good peripheral circulation. Respiratory: Normal respiratory effort.  No retractions. Gastrointestinal: Patient has a nonthrombosed and tender, nonbleeding external hemorrhoid with no evidence of perirectal or perianal abscess.  ED nurse present in the room during my assessment as chaperone. Musculoskeletal: No lower extremity tenderness nor edema. No gross deformities of extremities. Neurologic:  Normal speech and language. No gross focal neurologic deficits are appreciated.  Skin:  Skin is warm, dry and intact. Psychiatric: Mood and affect are normal. Speech and behavior are normal.  ____________________________________________   LABS (all labs ordered are listed, but only abnormal results are displayed)  Labs Reviewed - No data to display ____________________________________________  EKG  No indication for emergent EKG ____________________________________________  RADIOLOGY I, Hinda Kehr, personally viewed and evaluated these images (plain radiographs) as part of my medical decision making, as well as reviewing the written report by the radiologist.  ED MD interpretation: No indication for emergent imaging  Official radiology report(s): No results  found.  ____________________________________________   PROCEDURES   Procedure(s) performed (including Critical Care):  Procedures   ____________________________________________   INITIAL IMPRESSION / MDM / [place] / ED COURSE  As part of my medical decision making, I reviewed the following data within the [place] notes reviewed and incorporated, Old chart reviewed and Notes from prior ED visits  Nonthrombosed external hemorrhoid.  Vital signs stable.  No other complaints or concerns.  No indication for incision and drainage.  I discussed conservative, nonsurgical management of hemorrhoid with the patient, provided prescription as listed below and recommended over-the-counter medications and sitz baths.  Provided information about general surgery for outpatient follow-up.           ____________________________________________  FINAL CLINICAL IMPRESSION(S) / ED DIAGNOSES  Final diagnoses:  Hemorrhoids, unspecified hemorrhoid type     MEDICATIONS GIVEN DURING THIS VISIT:  Medications - No data to display   ED Discharge Orders         Ordered    hydrocortisone-pramoxine (PROCTOFOAM-HC) rectal foam  2 times daily        03/16/21 0153          *Please note:  Everlie  DANALY BARI was evaluated in Emergency Department on 03/16/2021 for the symptoms described in the history of present illness. She was evaluated in the context of the global COVID-19 pandemic, which necessitated consideration that the patient might be at risk for infection with the SARS-CoV-2 virus that causes COVID-19. Institutional protocols and algorithms that pertain to the evaluation of patients at risk for COVID-19 are in a state of rapid change based on information released by regulatory bodies including the CDC and federal and state organizations. These policies and algorithms were followed during the patient's care in the ED.  Some ED evaluations and interventions may  be delayed as a result of limited staffing during and after the pandemic.*  Note:  This document was prepared using Dragon voice recognition software and may include unintentional dictation errors.   Hinda Kehr, MD 03/16/21 [phone]

## 2021-03-16 NOTE — Discharge Instructions (Signed)
As we discussed, you are suffering from hemorrhoids.  There is no immediate fix for them, but please read through the included information for recommendations.  Sitting in warm water (Sitz baths) can help a lot.  You can also use over-the-counter ointments and creams.  You should also consider trying a high-fiber diet.  Please fill any prescriptions you may have been provided and use them as recommended on the label instructions.  Follow-up with the doctor listed for an outpatient follow-up visit to discuss additional treatment options.  Remember that hemorrhoids can bleed and this is frequently normal, but if the bleeding becomes severe, you may need to return to the emergency department for evaluation.  Also return to the emergency department if you develop new or worsening symptoms that concern you.

## 2021-11-21 ENCOUNTER — Other Ambulatory Visit: Payer: Self-pay

## 2021-11-21 ENCOUNTER — Emergency Department
Admission: EM | Admit: 2021-11-21 | Discharge: 2021-11-21 | Discharge status: Against Medical Advice | Payer: Medicaid Other | Attending: Emergency Medicine | Admitting: Emergency Medicine

## 2021-11-21 DIAGNOSIS — L299 Pruritus, unspecified: Secondary | ICD-10-CM | POA: Diagnosis not present

## 2021-11-21 DIAGNOSIS — L509 Urticaria, unspecified: Secondary | ICD-10-CM | POA: Insufficient documentation

## 2021-11-21 DIAGNOSIS — Z5321 Procedure and treatment not carried out due to patient leaving prior to being seen by health care provider: Secondary | ICD-10-CM | POA: Insufficient documentation

## 2021-11-21 MED ORDER — DIPHENHYDRAMINE HCL 25 MG PO CAPS
50.0000 mg | ORAL_CAPSULE | Freq: Once | ORAL | Status: AC
  Administered 2021-11-21: 50 mg via ORAL

## 2021-11-21 MED ORDER — DIPHENHYDRAMINE HCL 25 MG PO CAPS
ORAL_CAPSULE | ORAL | Status: AC
  Filled 2021-11-21: qty 2

## 2021-11-21 NOTE — ED Triage Notes (Addendum)
Pt presents to ER c/o hives and itching all over since appx 1900 yesterday.  Pt denies any new foods or detergent use.  Denies sob or feeling like her throat is swelling.  Pt in NAD in triage at this time. A&O x4.
